# Patient Record
Sex: Female | Born: 1965
Health system: Southern US, Community
[De-identification: ages and names within clinical notes are randomized; demographics above are authoritative.]

## PROBLEM LIST (undated history)

## (undated) DIAGNOSIS — F419 Anxiety disorder, unspecified: Secondary | ICD-10-CM

## (undated) HISTORY — PX: TUBAL LIGATION: SHX77

## (undated) HISTORY — DX: Anxiety disorder, unspecified: F41.9

---

## 1999-02-20 ENCOUNTER — Emergency Department (HOSPITAL_COMMUNITY): Admission: EM | Admit: 1999-02-20 | Discharge: 1999-02-20 | Payer: Self-pay | Admitting: Emergency Medicine

## 1999-02-20 ENCOUNTER — Encounter: Payer: Self-pay | Admitting: Emergency Medicine

## 1999-08-29 ENCOUNTER — Encounter (HOSPITAL_BASED_OUTPATIENT_CLINIC_OR_DEPARTMENT_OTHER): Payer: Self-pay | Admitting: General Surgery

## 1999-08-29 ENCOUNTER — Emergency Department (HOSPITAL_COMMUNITY): Admission: EM | Admit: 1999-08-29 | Discharge: 1999-08-29 | Payer: Self-pay | Admitting: Emergency Medicine

## 2000-07-08 ENCOUNTER — Encounter: Payer: Self-pay | Admitting: Family Medicine

## 2000-07-08 ENCOUNTER — Encounter: Admission: RE | Admit: 2000-07-08 | Discharge: 2000-07-08 | Payer: Self-pay | Admitting: Family Medicine

## 2000-09-26 ENCOUNTER — Encounter: Payer: Self-pay | Admitting: Family Medicine

## 2000-09-26 ENCOUNTER — Encounter: Admission: RE | Admit: 2000-09-26 | Discharge: 2000-09-26 | Payer: Self-pay | Admitting: Family Medicine

## 2000-10-16 ENCOUNTER — Encounter: Admission: RE | Admit: 2000-10-16 | Discharge: 2000-10-21 | Payer: Self-pay | Admitting: Family Medicine

## 2002-06-21 ENCOUNTER — Emergency Department (HOSPITAL_COMMUNITY): Admission: EM | Admit: 2002-06-21 | Discharge: 2002-06-22 | Payer: Self-pay

## 2011-01-26 ENCOUNTER — Encounter: Payer: Self-pay | Admitting: Emergency Medicine

## 2011-01-26 ENCOUNTER — Emergency Department (HOSPITAL_COMMUNITY): Payer: No Typology Code available for payment source

## 2011-01-26 ENCOUNTER — Emergency Department (HOSPITAL_COMMUNITY)
Admission: EM | Admit: 2011-01-26 | Discharge: 2011-01-26 | Disposition: A | Discharge status: Home / Self Care | Payer: No Typology Code available for payment source | Attending: Emergency Medicine | Admitting: Emergency Medicine

## 2011-01-26 DIAGNOSIS — R0789 Other chest pain: Secondary | ICD-10-CM | POA: Insufficient documentation

## 2011-01-26 DIAGNOSIS — S335XXA Sprain of ligaments of lumbar spine, initial encounter: Secondary | ICD-10-CM | POA: Insufficient documentation

## 2011-01-26 DIAGNOSIS — M545 Low back pain, unspecified: Secondary | ICD-10-CM | POA: Insufficient documentation

## 2011-01-26 DIAGNOSIS — S39012A Strain of muscle, fascia and tendon of lower back, initial encounter: Secondary | ICD-10-CM

## 2011-01-26 MED ORDER — CYCLOBENZAPRINE HCL 10 MG PO TABS: 10.0000 mg | ORAL_TABLET | Freq: Three times a day (TID) | ORAL | Status: AC | PRN

## 2011-01-26 MED ORDER — OXYCODONE-ACETAMINOPHEN 5-325 MG PO TABS
1.0000 | ORAL_TABLET | Freq: Once | ORAL | Status: AC
  Administered 2011-01-26: 1 via ORAL
  Filled 2011-01-26: qty 1

## 2011-01-26 MED ORDER — IBUPROFEN 800 MG PO TABS: 800.0000 mg | ORAL_TABLET | Freq: Three times a day (TID) | ORAL | Status: AC | PRN

## 2011-01-26 MED ORDER — HYDROCODONE-ACETAMINOPHEN 5-325 MG PO TABS: 1.0000 | ORAL_TABLET | Freq: Four times a day (QID) | ORAL | Status: AC | PRN
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
## 2011-01-26 NOTE — ED Notes (Signed)
C-collar removed by RN per PA.

## 2011-01-29 ENCOUNTER — Encounter (HOSPITAL_COMMUNITY): Payer: Self-pay | Admitting: *Deleted

## 2011-01-29 ENCOUNTER — Emergency Department (INDEPENDENT_AMBULATORY_CARE_PROVIDER_SITE_OTHER)
Admission: EM | Admit: 2011-01-29 | Discharge: 2011-01-29 | Disposition: A | Discharge status: Home / Self Care | Payer: Self-pay | Source: Home / Self Care

## 2011-01-29 DIAGNOSIS — S161XXA Strain of muscle, fascia and tendon at neck level, initial encounter: Secondary | ICD-10-CM

## 2011-01-29 DIAGNOSIS — S39012A Strain of muscle, fascia and tendon of lower back, initial encounter: Secondary | ICD-10-CM

## 2011-01-29 DIAGNOSIS — S335XXA Sprain of ligaments of lumbar spine, initial encounter: Secondary | ICD-10-CM

## 2011-01-29 DIAGNOSIS — S139XXA Sprain of joints and ligaments of unspecified parts of neck, initial encounter: Secondary | ICD-10-CM

## 2011-01-29 NOTE — ED Provider Notes (Signed)
Medical screening examination/treatment/procedure(s) were performed by non-physician practitioner and as supervising physician I was immediately available for consultation/collaboration. Simuel Stebner Y.   Gavin Pound. Azaylea Maves, MD 01/29/11 1015
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
## 2011-01-29 NOTE — ED Provider Notes (Signed)
Medical screening examination/treatment/procedure(s) were performed by non-physician practitioner and as supervising physician I was immediately available for consultation/collaboration.  Raynald Blend, MD 01/29/11 1220
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
## 2011-02-01 ENCOUNTER — Ambulatory Visit: Payer: BC Managed Care – PPO

## 2011-02-01 NOTE — ED Notes (Signed)
Pt. brought in FMLA papers.  She was seen in ED 12/28 -no work note. Came here 12/31 and we gave work note to return 1/2. This put her into FMLA.  Pt. does not have a PCP.  Will give to [facility] PA.Vassie Moselle  02/01/2011

## 2011-02-07 NOTE — ED Notes (Signed)
1/7  Dawn said pt. was still having pain and should f/u with the orthopedist. He is the one that should fill them out because pt. may need PT and be out longer.  I called pt. 1/8 and left message to call. Pt. called back today and she said she already had it taken care of. I asked her to come back and pick up her papers. I would leave them at the front desk.  Pt. said she would do this. Vassie Moselle 02/07/2011

## 2011-07-24 ENCOUNTER — Ambulatory Visit (INDEPENDENT_AMBULATORY_CARE_PROVIDER_SITE_OTHER): Payer: BC Managed Care – PPO | Admitting: Physician Assistant

## 2011-07-24 VITALS — BP 136/82 | HR 65 | Temp 98.6°F | Resp 16 | Ht 64.5 in | Wt 158.4 lb

## 2011-07-24 DIAGNOSIS — Z111 Encounter for screening for respiratory tuberculosis: Secondary | ICD-10-CM

## 2011-07-24 NOTE — Patient Instructions (Signed)
Read TB test in 48-72 hrs  

## 2011-07-24 NOTE — Progress Notes (Signed)
  Subjective:    Patient ID: Michelle Hanson, female    DOB: 1965-08-06, 46 y.o.   MRN: [id number]  HPI No health problems.  Only here for TB test.  No h/o exposure.  No night sweats, cough, or weight loss.   Review of Systems  All other systems reviewed and are negative.       Objective:   Physical Exam  Nursing note and vitals reviewed. Constitutional: She appears well-developed and well-nourished.  HENT:  Head: Normocephalic and atraumatic.  Cardiovascular: Normal rate, regular rhythm and normal heart sounds.   Pulmonary/Chest: Effort normal and breath sounds normal.       Assessment & Plan:  Screening for TB-placing TB

## 2011-07-26 ENCOUNTER — Encounter (INDEPENDENT_AMBULATORY_CARE_PROVIDER_SITE_OTHER): Payer: BC Managed Care – PPO

## 2011-07-26 DIAGNOSIS — Z111 Encounter for screening for respiratory tuberculosis: Secondary | ICD-10-CM

## 2011-07-26 LAB — TB SKIN TEST
Induration: 0 mm
TB Skin Test: NEGATIVE

## 2011-08-16 ENCOUNTER — Other Ambulatory Visit: Payer: Self-pay | Admitting: Family Medicine

## 2011-08-16 DIAGNOSIS — R1032 Left lower quadrant pain: Secondary | ICD-10-CM

## 2011-08-17 ENCOUNTER — Ambulatory Visit
Admission: RE | Admit: 2011-08-17 | Discharge: 2011-08-17 | Disposition: A | Discharge status: Home / Self Care | Payer: BC Managed Care – PPO | Source: Ambulatory Visit | Attending: Family Medicine | Admitting: Family Medicine

## 2011-08-17 DIAGNOSIS — R1032 Left lower quadrant pain: Secondary | ICD-10-CM

## 2011-08-17 MED ORDER — IOHEXOL 300 MG/ML  SOLN
100.0000 mL | Freq: Once | INTRAMUSCULAR | Status: AC | PRN
  Administered 2011-08-17: 100 mL via INTRAVENOUS

## 2012-03-27 ENCOUNTER — Other Ambulatory Visit: Payer: Self-pay

## 2012-03-27 DIAGNOSIS — Z1231 Encounter for screening mammogram for malignant neoplasm of breast: Secondary | ICD-10-CM

## 2012-04-25 ENCOUNTER — Ambulatory Visit: Payer: BC Managed Care – PPO

## 2012-05-16 ENCOUNTER — Emergency Department (HOSPITAL_COMMUNITY)
Admission: EM | Admit: 2012-05-16 | Discharge: 2012-05-16 | Disposition: A | Discharge status: Home / Self Care | Payer: No Typology Code available for payment source | Attending: Emergency Medicine | Admitting: Emergency Medicine

## 2012-05-16 ENCOUNTER — Encounter (HOSPITAL_COMMUNITY): Payer: Self-pay | Admitting: *Deleted

## 2012-05-16 DIAGNOSIS — Y9389 Activity, other specified: Secondary | ICD-10-CM | POA: Insufficient documentation

## 2012-05-16 DIAGNOSIS — IMO0002 Reserved for concepts with insufficient information to code with codable children: Secondary | ICD-10-CM | POA: Insufficient documentation

## 2012-05-16 DIAGNOSIS — Y92410 Unspecified street and highway as the place of occurrence of the external cause: Secondary | ICD-10-CM | POA: Insufficient documentation

## 2012-05-16 DIAGNOSIS — T148XXA Other injury of unspecified body region, initial encounter: Secondary | ICD-10-CM

## 2012-05-16 MED ORDER — HYDROCODONE-ACETAMINOPHEN 5-325 MG PO TABS: 1.0000 | ORAL_TABLET | ORAL | Status: DC | PRN

## 2012-05-16 MED ORDER — CYCLOBENZAPRINE HCL 10 MG PO TABS: 10.0000 mg | ORAL_TABLET | Freq: Three times a day (TID) | ORAL | Status: DC

## 2012-05-16 MED ORDER — HYDROCODONE-ACETAMINOPHEN 5-325 MG PO TABS: 1.0000 | ORAL_TABLET | Freq: Once | ORAL | Status: DC

## 2012-05-16 MED ORDER — CYCLOBENZAPRINE HCL 10 MG PO TABS: 5.0000 mg | ORAL_TABLET | Freq: Once | ORAL | Status: DC

## 2012-05-16 MED ORDER — IBUPROFEN 800 MG PO TABS: 800.0000 mg | ORAL_TABLET | Freq: Three times a day (TID) | ORAL | Status: DC
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
## 2012-05-17 NOTE — ED Provider Notes (Signed)
Medical screening examination/treatment/procedure(s) were performed by non-physician practitioner and as supervising physician I was immediately available for consultation/collaboration.   Nahomy Limburg E Madaleine Simmon, MD 05/17/12 1202 

## 2012-12-23 ENCOUNTER — Ambulatory Visit: Payer: BC Managed Care – PPO

## 2013-04-22 ENCOUNTER — Other Ambulatory Visit: Payer: Self-pay

## 2013-04-22 DIAGNOSIS — Z1231 Encounter for screening mammogram for malignant neoplasm of breast: Secondary | ICD-10-CM

## 2013-05-18 ENCOUNTER — Ambulatory Visit
Admission: RE | Admit: 2013-05-18 | Discharge: 2013-05-18 | Disposition: A | Discharge status: Home / Self Care | Payer: BC Managed Care – PPO | Source: Ambulatory Visit

## 2013-05-18 DIAGNOSIS — Z1231 Encounter for screening mammogram for malignant neoplasm of breast: Secondary | ICD-10-CM

## 2013-05-21 ENCOUNTER — Other Ambulatory Visit: Payer: Self-pay | Admitting: Obstetrics and Gynecology

## 2013-05-21 DIAGNOSIS — R928 Other abnormal and inconclusive findings on diagnostic imaging of breast: Secondary | ICD-10-CM

## 2013-06-01 ENCOUNTER — Ambulatory Visit
Admission: RE | Admit: 2013-06-01 | Discharge: 2013-06-01 | Disposition: A | Discharge status: Home / Self Care | Payer: BC Managed Care – PPO | Source: Ambulatory Visit | Attending: Obstetrics and Gynecology | Admitting: Obstetrics and Gynecology

## 2013-06-01 DIAGNOSIS — R928 Other abnormal and inconclusive findings on diagnostic imaging of breast: Secondary | ICD-10-CM

## 2015-07-21 ENCOUNTER — Other Ambulatory Visit: Payer: Self-pay | Admitting: Family Medicine

## 2015-07-21 ENCOUNTER — Ambulatory Visit
Admission: RE | Admit: 2015-07-21 | Discharge: 2015-07-21 | Disposition: A | Discharge status: Home / Self Care | Payer: BLUE CROSS/BLUE SHIELD | Source: Ambulatory Visit | Attending: Family Medicine | Admitting: Family Medicine

## 2015-07-21 DIAGNOSIS — K6389 Other specified diseases of intestine: Secondary | ICD-10-CM | POA: Diagnosis not present

## 2015-07-21 DIAGNOSIS — R1030 Lower abdominal pain, unspecified: Secondary | ICD-10-CM | POA: Diagnosis not present

## 2015-07-21 DIAGNOSIS — K5901 Slow transit constipation: Secondary | ICD-10-CM | POA: Diagnosis not present

## 2015-08-15 DIAGNOSIS — Z111 Encounter for screening for respiratory tuberculosis: Secondary | ICD-10-CM | POA: Diagnosis not present

## 2015-08-15 DIAGNOSIS — K5901 Slow transit constipation: Secondary | ICD-10-CM | POA: Diagnosis not present

## 2015-08-15 DIAGNOSIS — Z0000 Encounter for general adult medical examination without abnormal findings: Secondary | ICD-10-CM | POA: Diagnosis not present

## 2015-08-15 DIAGNOSIS — N3281 Overactive bladder: Secondary | ICD-10-CM | POA: Diagnosis not present

## 2015-08-15 DIAGNOSIS — Z13220 Encounter for screening for lipoid disorders: Secondary | ICD-10-CM | POA: Diagnosis not present

## 2015-08-17 ENCOUNTER — Other Ambulatory Visit: Payer: Self-pay | Admitting: Family Medicine

## 2015-08-17 DIAGNOSIS — R5381 Other malaise: Secondary | ICD-10-CM

## 2015-08-25 ENCOUNTER — Other Ambulatory Visit: Payer: Self-pay | Admitting: Family Medicine

## 2015-08-25 DIAGNOSIS — R921 Mammographic calcification found on diagnostic imaging of breast: Secondary | ICD-10-CM

## 2015-08-29 ENCOUNTER — Other Ambulatory Visit: Payer: BLUE CROSS/BLUE SHIELD

## 2015-09-26 ENCOUNTER — Ambulatory Visit
Admission: RE | Admit: 2015-09-26 | Discharge: 2015-09-26 | Disposition: A | Discharge status: Home / Self Care | Payer: BLUE CROSS/BLUE SHIELD | Source: Ambulatory Visit | Attending: Family Medicine | Admitting: Family Medicine

## 2015-09-26 DIAGNOSIS — N6002 Solitary cyst of left breast: Secondary | ICD-10-CM | POA: Diagnosis not present

## 2015-09-26 DIAGNOSIS — R921 Mammographic calcification found on diagnostic imaging of breast: Secondary | ICD-10-CM

## 2016-06-14 DIAGNOSIS — R1033 Periumbilical pain: Secondary | ICD-10-CM | POA: Diagnosis not present

## 2016-06-14 DIAGNOSIS — K5901 Slow transit constipation: Secondary | ICD-10-CM | POA: Diagnosis not present

## 2016-08-21 DIAGNOSIS — K5909 Other constipation: Secondary | ICD-10-CM | POA: Diagnosis not present

## 2016-08-21 DIAGNOSIS — R0789 Other chest pain: Secondary | ICD-10-CM | POA: Diagnosis not present

## 2016-08-21 DIAGNOSIS — Z111 Encounter for screening for respiratory tuberculosis: Secondary | ICD-10-CM | POA: Diagnosis not present

## 2016-08-21 DIAGNOSIS — Z0000 Encounter for general adult medical examination without abnormal findings: Secondary | ICD-10-CM | POA: Diagnosis not present

## 2016-10-30 ENCOUNTER — Other Ambulatory Visit: Payer: Self-pay | Admitting: Obstetrics and Gynecology

## 2016-10-30 DIAGNOSIS — Z1231 Encounter for screening mammogram for malignant neoplasm of breast: Secondary | ICD-10-CM

## 2016-11-08 DIAGNOSIS — Z6832 Body mass index (BMI) 32.0-32.9, adult: Secondary | ICD-10-CM | POA: Diagnosis not present

## 2016-11-08 DIAGNOSIS — Z01419 Encounter for gynecological examination (general) (routine) without abnormal findings: Secondary | ICD-10-CM | POA: Diagnosis not present

## 2016-11-08 DIAGNOSIS — Z1151 Encounter for screening for human papillomavirus (HPV): Secondary | ICD-10-CM | POA: Diagnosis not present

## 2016-11-08 DIAGNOSIS — R87610 Atypical squamous cells of undetermined significance on cytologic smear of cervix (ASC-US): Secondary | ICD-10-CM | POA: Diagnosis not present

## 2016-11-08 DIAGNOSIS — Z1231 Encounter for screening mammogram for malignant neoplasm of breast: Secondary | ICD-10-CM | POA: Diagnosis not present

## 2016-11-08 DIAGNOSIS — R102 Pelvic and perineal pain: Secondary | ICD-10-CM | POA: Diagnosis not present

## 2016-11-20 ENCOUNTER — Ambulatory Visit: Payer: BLUE CROSS/BLUE SHIELD

## 2016-11-22 DIAGNOSIS — D259 Leiomyoma of uterus, unspecified: Secondary | ICD-10-CM | POA: Diagnosis not present

## 2016-11-22 DIAGNOSIS — N944 Primary dysmenorrhea: Secondary | ICD-10-CM | POA: Diagnosis not present

## 2017-07-08 IMAGING — CR DG ABDOMEN 2V
2 series · 2 of 2 positions shown · non-contrast
Comparison: CT 08/17/2011.  KUB 01/26/2011 .

CLINICAL DATA: Pain.

EXAM:
ABDOMEN - 2 VIEW

[w abdomen upright]
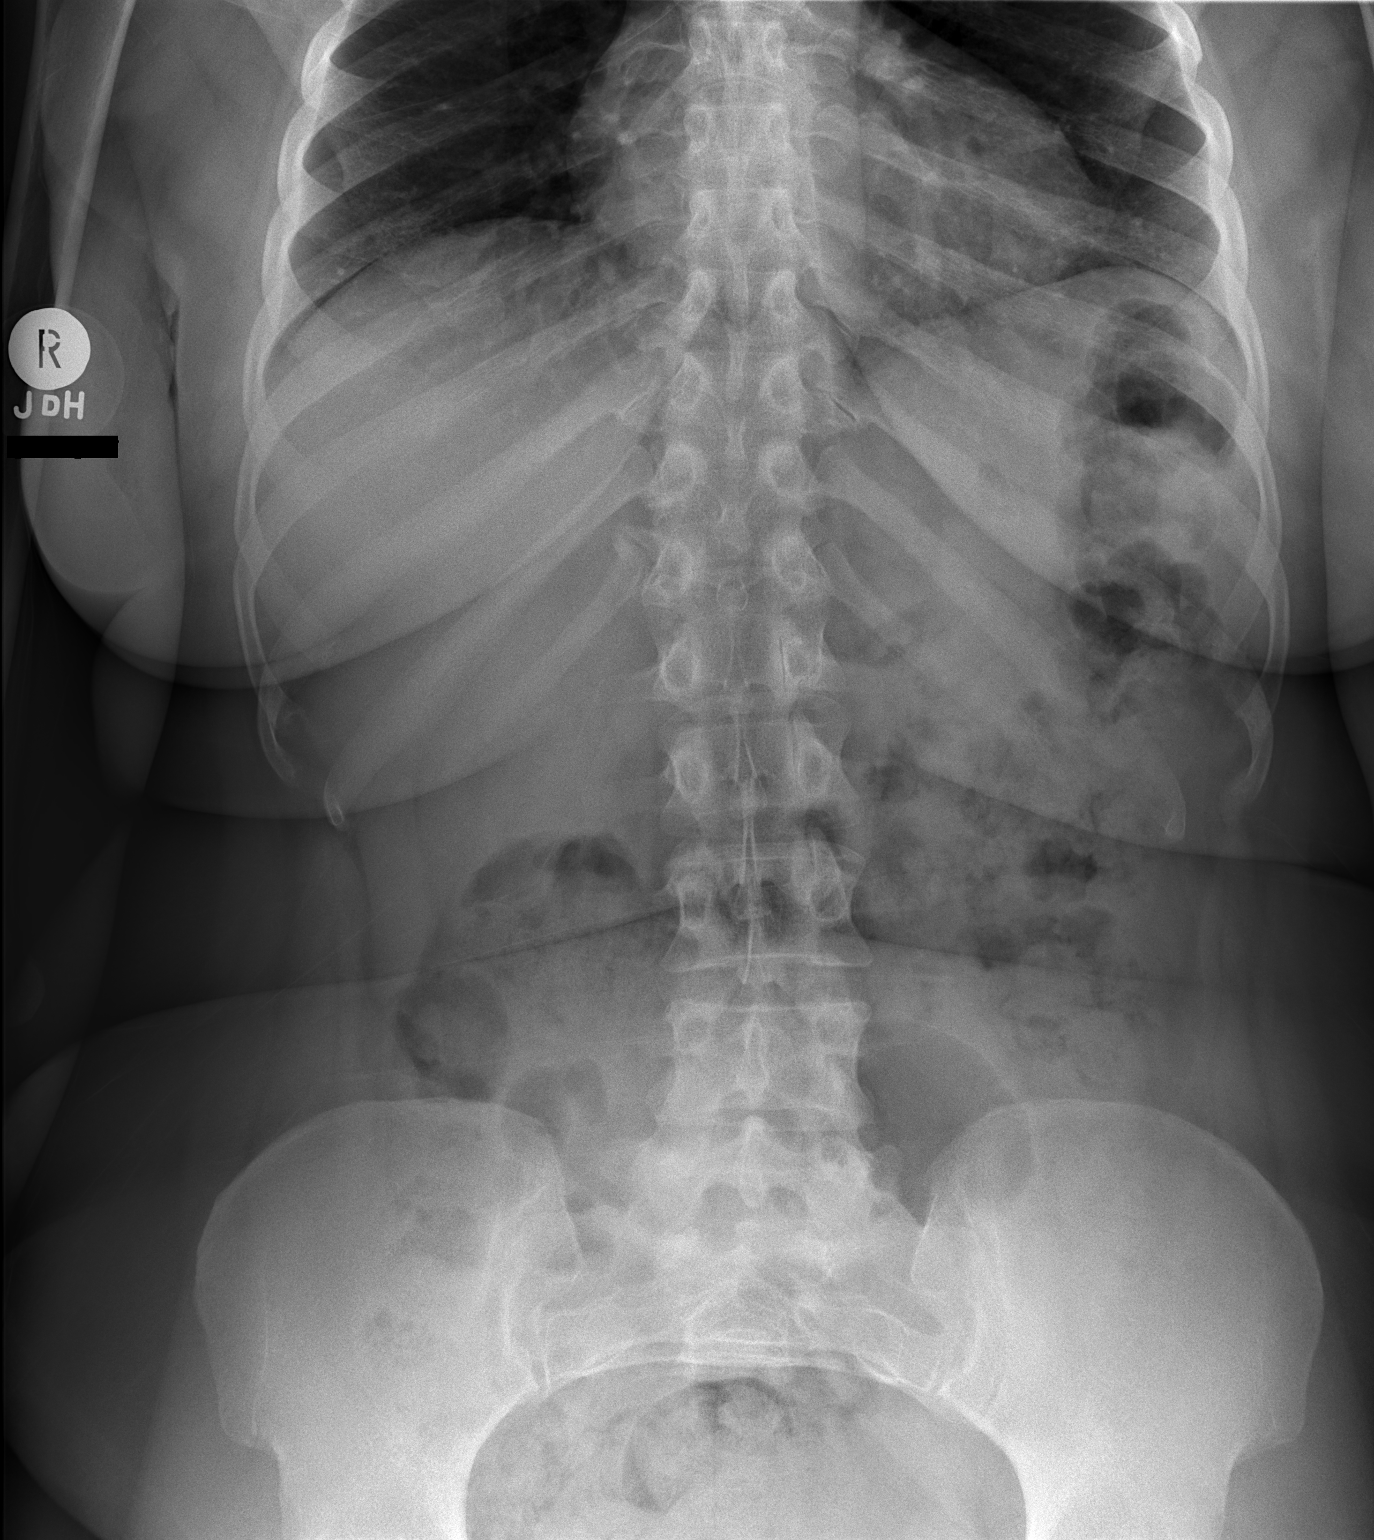

[t abdomen supine]
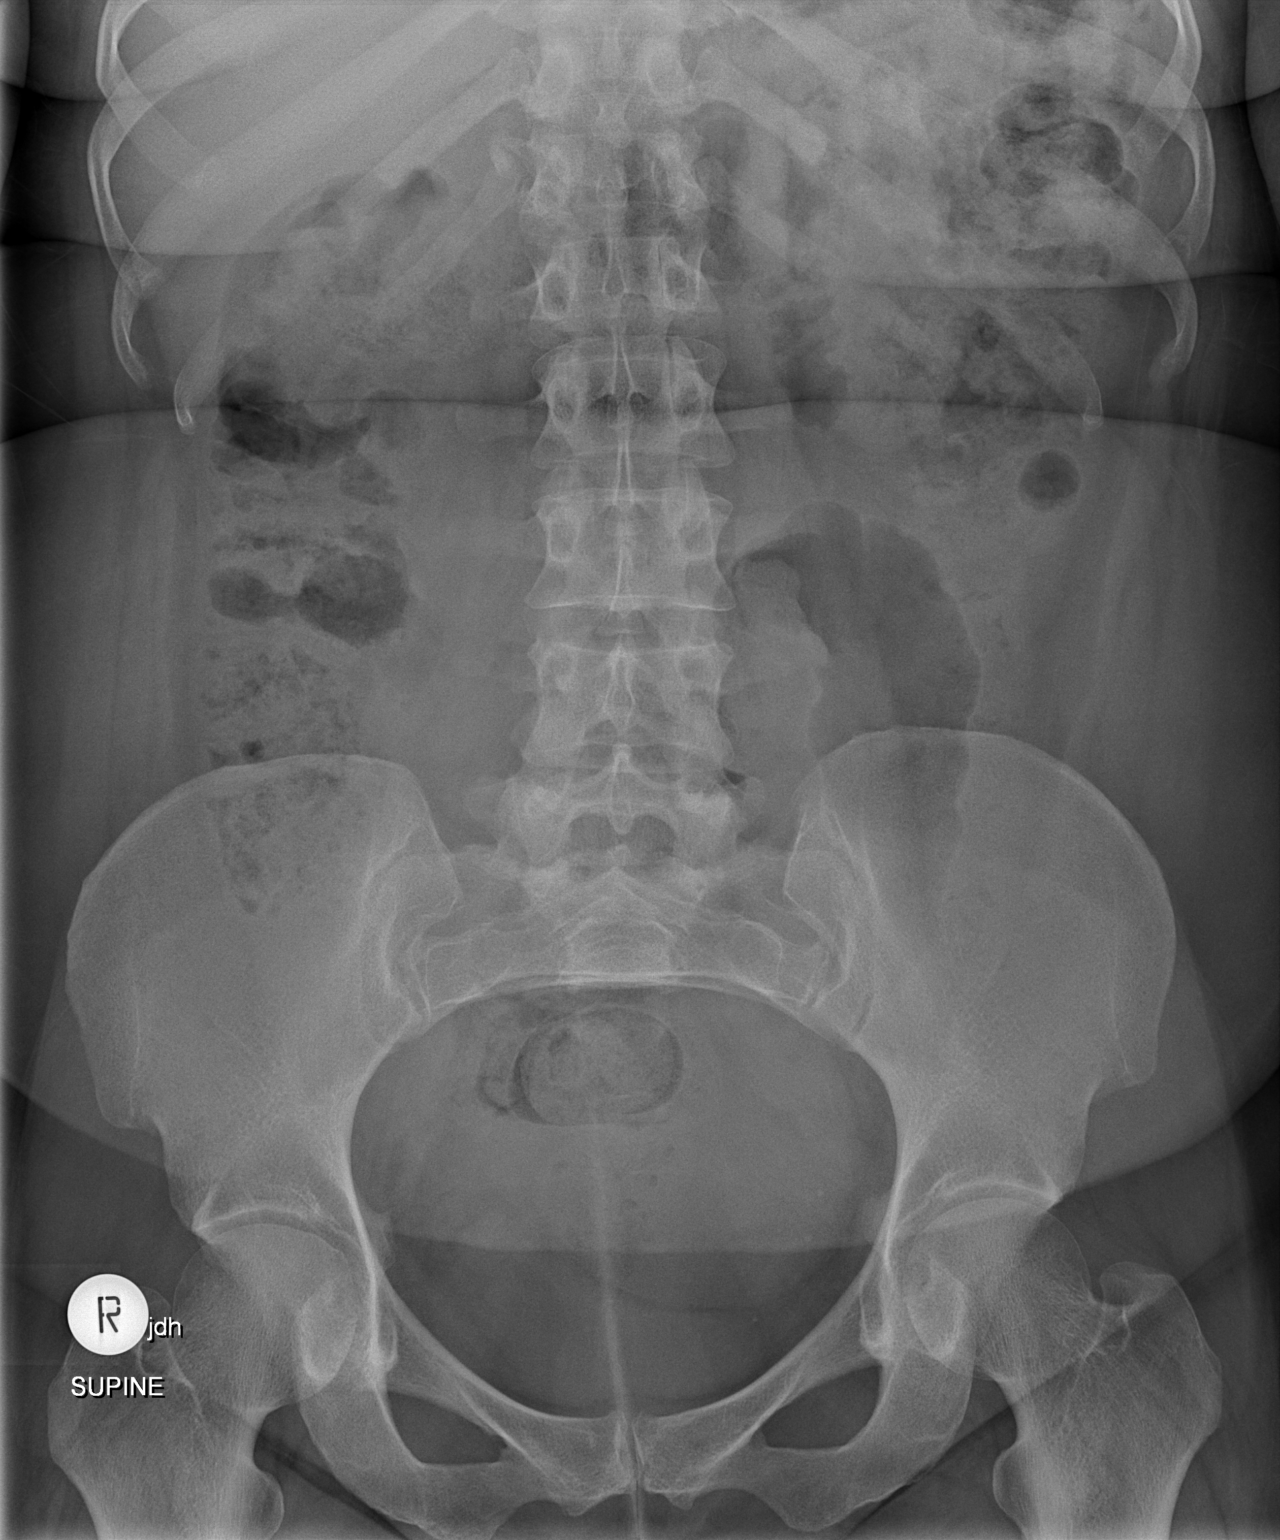

[2 of 2 positions shown; findings below may reference images not displayed]

FINDINGS: Soft tissue structures are unremarkable. Large amount of stool noted
throughout the colon. Constipation cannot be excluded. Mild
associated bowel distention. Follow-up abdominal series suggested
for continued evaluate. No free air. No acute bony abnormality.
IMPRESSION: Large amount of stool noted throughout the colon consistent with
constipation. Mild associated bowel distention. Follow-up abdominal
series suggested to exclude progressive bowel distention.

## 2017-08-27 DIAGNOSIS — R230 Cyanosis: Secondary | ICD-10-CM | POA: Diagnosis not present

## 2017-08-27 DIAGNOSIS — Z130 Encounter for screening for diseases of the blood and blood-forming organs and certain disorders involving the immune mechanism: Secondary | ICD-10-CM | POA: Diagnosis not present

## 2017-08-27 DIAGNOSIS — R232 Flushing: Secondary | ICD-10-CM | POA: Diagnosis not present

## 2017-08-27 DIAGNOSIS — R899 Unspecified abnormal finding in specimens from other organs, systems and tissues: Secondary | ICD-10-CM | POA: Diagnosis not present

## 2017-08-27 DIAGNOSIS — Z0000 Encounter for general adult medical examination without abnormal findings: Secondary | ICD-10-CM | POA: Diagnosis not present

## 2017-08-27 DIAGNOSIS — D259 Leiomyoma of uterus, unspecified: Secondary | ICD-10-CM | POA: Diagnosis not present

## 2017-08-27 DIAGNOSIS — Z111 Encounter for screening for respiratory tuberculosis: Secondary | ICD-10-CM | POA: Diagnosis not present

## 2017-08-27 DIAGNOSIS — R109 Unspecified abdominal pain: Secondary | ICD-10-CM | POA: Diagnosis not present

## 2017-08-27 DIAGNOSIS — Z1329 Encounter for screening for other suspected endocrine disorder: Secondary | ICD-10-CM | POA: Diagnosis not present

## 2017-08-29 DIAGNOSIS — Z111 Encounter for screening for respiratory tuberculosis: Secondary | ICD-10-CM | POA: Diagnosis not present

## 2017-11-11 DIAGNOSIS — Z01419 Encounter for gynecological examination (general) (routine) without abnormal findings: Secondary | ICD-10-CM | POA: Diagnosis not present

## 2017-11-11 DIAGNOSIS — Z6833 Body mass index (BMI) 33.0-33.9, adult: Secondary | ICD-10-CM | POA: Diagnosis not present

## 2017-11-11 DIAGNOSIS — Z13220 Encounter for screening for lipoid disorders: Secondary | ICD-10-CM | POA: Diagnosis not present

## 2017-11-11 DIAGNOSIS — Z1151 Encounter for screening for human papillomavirus (HPV): Secondary | ICD-10-CM | POA: Diagnosis not present

## 2017-11-11 DIAGNOSIS — Z1231 Encounter for screening mammogram for malignant neoplasm of breast: Secondary | ICD-10-CM | POA: Diagnosis not present

## 2018-04-02 DIAGNOSIS — R42 Dizziness and giddiness: Secondary | ICD-10-CM | POA: Diagnosis not present

## 2018-04-02 DIAGNOSIS — K5909 Other constipation: Secondary | ICD-10-CM | POA: Diagnosis not present

## 2018-04-23 DIAGNOSIS — G43009 Migraine without aura, not intractable, without status migrainosus: Secondary | ICD-10-CM | POA: Diagnosis not present

## 2018-04-23 DIAGNOSIS — K5901 Slow transit constipation: Secondary | ICD-10-CM | POA: Diagnosis not present

## 2018-09-02 DIAGNOSIS — K5900 Constipation, unspecified: Secondary | ICD-10-CM | POA: Diagnosis not present

## 2018-09-02 DIAGNOSIS — R109 Unspecified abdominal pain: Secondary | ICD-10-CM | POA: Diagnosis not present

## 2018-09-02 DIAGNOSIS — K921 Melena: Secondary | ICD-10-CM | POA: Diagnosis not present

## 2018-10-13 DIAGNOSIS — Z1159 Encounter for screening for other viral diseases: Secondary | ICD-10-CM | POA: Diagnosis not present

## 2018-10-17 DIAGNOSIS — R1084 Generalized abdominal pain: Secondary | ICD-10-CM | POA: Diagnosis not present

## 2018-10-17 DIAGNOSIS — K921 Melena: Secondary | ICD-10-CM | POA: Diagnosis not present

## 2018-10-17 DIAGNOSIS — K5900 Constipation, unspecified: Secondary | ICD-10-CM | POA: Diagnosis not present

## 2018-10-17 DIAGNOSIS — Z1211 Encounter for screening for malignant neoplasm of colon: Secondary | ICD-10-CM | POA: Diagnosis not present

## 2018-10-24 DIAGNOSIS — Z79899 Other long term (current) drug therapy: Secondary | ICD-10-CM | POA: Diagnosis not present

## 2018-10-24 DIAGNOSIS — F419 Anxiety disorder, unspecified: Secondary | ICD-10-CM | POA: Diagnosis not present

## 2018-10-24 DIAGNOSIS — R079 Chest pain, unspecified: Secondary | ICD-10-CM | POA: Diagnosis not present

## 2018-10-24 DIAGNOSIS — I10 Essential (primary) hypertension: Secondary | ICD-10-CM | POA: Diagnosis not present

## 2018-10-24 DIAGNOSIS — Z7982 Long term (current) use of aspirin: Secondary | ICD-10-CM | POA: Diagnosis not present

## 2018-10-24 DIAGNOSIS — R42 Dizziness and giddiness: Secondary | ICD-10-CM | POA: Diagnosis not present

## 2018-10-24 DIAGNOSIS — R450 Nervousness: Secondary | ICD-10-CM | POA: Diagnosis not present

## 2018-10-24 DIAGNOSIS — R002 Palpitations: Secondary | ICD-10-CM | POA: Diagnosis not present

## 2018-10-27 DIAGNOSIS — F419 Anxiety disorder, unspecified: Secondary | ICD-10-CM | POA: Diagnosis not present

## 2018-10-27 DIAGNOSIS — R002 Palpitations: Secondary | ICD-10-CM | POA: Diagnosis not present

## 2018-11-05 ENCOUNTER — Encounter: Payer: Self-pay | Admitting: Cardiology

## 2018-11-05 ENCOUNTER — Ambulatory Visit: Payer: BC Managed Care – PPO | Admitting: Cardiology

## 2018-11-05 ENCOUNTER — Other Ambulatory Visit: Payer: Self-pay

## 2018-11-05 VITALS — BP 131/83 | HR 75 | Ht 64.5 in | Wt 187.2 lb

## 2018-11-05 DIAGNOSIS — I493 Ventricular premature depolarization: Secondary | ICD-10-CM

## 2018-11-05 DIAGNOSIS — R002 Palpitations: Secondary | ICD-10-CM

## 2018-11-05 DIAGNOSIS — R0789 Other chest pain: Secondary | ICD-10-CM

## 2018-11-05 DIAGNOSIS — F419 Anxiety disorder, unspecified: Secondary | ICD-10-CM

## 2018-11-05 DIAGNOSIS — R030 Elevated blood-pressure reading, without diagnosis of hypertension: Secondary | ICD-10-CM

## 2018-11-05 DIAGNOSIS — R0602 Shortness of breath: Secondary | ICD-10-CM

## 2018-11-05 MED ORDER — METOPROLOL SUCCINATE ER 25 MG PO TB24: 25.0000 mg | ORAL_TABLET | Freq: Every day | ORAL | 3 refills | Status: DC

## 2018-11-05 NOTE — Progress Notes (Signed)
Cardiology Consult Note    Date:  11/09/2018   ID:  [name], DOB 03-11-1965, MRN [id number]  PCP:  Patient, No Pcp Per  Cardiologist:  Fransico Him, MD   Chief Complaint  Patient presents with  . New Patient (Initial Visit)    Palptitations    History of Present Illness:  Michelle Hanson is a 53 y.o. female who is being seen today for the evaluation of palpitations at the request of Shirline Frees, MD.  This is a 53yo female with no prior medical hx who recently was seen by her PCP complaining of palpitations.  She recently started having palpitations mainly at night where her heart would be "beating out of my chest" associated with SOB. She has had several episodes of this and one episode resulted in EMS being called and she was found to have an elevated BP at 202/2mmHg and HR 84bpm.  EKG in ER showed NSR with PVCs. She tells me that she has have a lot of anxiety going to work recently due to [place] and was placed on Lorazepam and Propranolol.  She also had been having episodic pressure in her chest with the palpitations.  She does a lot of strenuous work at her job and notices significant SOB after carrying things up 3 flights of stairs.   She denies any PND, orthopnea, LE edema or syncope. She will have some dizziness with the palpitations. She is compliant with her meds and is tolerating meds with no SE.    Past Medical History:  Diagnosis Date  . Anxiety     Past Surgical History:  Procedure Laterality Date  . TUBAL LIGATION      Current Medications: Current Meds  Medication Sig  . ibuprofen (ADVIL,MOTRIN) 800 MG tablet Take 1 tablet (800 mg total) by mouth 3 (three) times daily.  Marland Kitchen LORazepam (ATIVAN) 0.5 MG tablet TAKE 1 TABLET BY MOUTH ONCE DAILY AS NEEDED FOR ANXIETY  . Multiple Vitamin (MULTIVITAMIN WITH MINERALS) TABS Take 1 tablet by mouth daily.  . Multiple Vitamin (MULTIVITAMIN) tablet Take 1 tablet by mouth daily.  . [DISCONTINUED] propranolol ER (INDERAL  LA) 60 MG 24 hr capsule TAKE 1 CAPSULE BY MOUTH ONCE DAILY UNTIL DIRECTED TO STOP. USE AT NIGHT FOR CALMNESS    Allergies:   Patient has no known allergies.   Social History   Socioeconomic History  . Marital status: Divorced    Spouse name: Not on file  . Number of children: Not on file  . Years of education: Not on file  . Highest education level: Not on file  Occupational History  . Not on file  Social Needs  . Financial resource strain: Not on file  . Food insecurity    Worry: Not on file    Inability: Not on file  . Transportation needs    Medical: Not on file    Non-medical: Not on file  Tobacco Use  . Smoking status: Never Smoker  . Smokeless tobacco: Never Used  Substance and Sexual Activity  . Alcohol use: No  . Drug use: No  . Sexual activity: Yes    Birth control/protection: Injection  Lifestyle  . Physical activity    Days per week: Not on file    Minutes per session: Not on file  . Stress: Not on file  Relationships  . Social Herbalist on phone: Not on file    Gets together: Not on file    Attends religious  service: Not on file    Active member of club or organization: Not on file    Attends meetings of clubs or organizations: Not on file    Relationship status: Not on file  Other Topics Concern  . Not on file  Social History Narrative  . Not on file     Family History:  The patient's family history is not on file.   ROS:   Please see the history of present illness.    ROS All other systems reviewed and are negative.  No flowsheet data found.     PHYSICAL EXAM:   VS:  BP 131/83   Pulse 75   Ht 5' 4.5" (1.638 m)   Wt 187 lb 3.2 oz (84.9 kg)   BMI 31.64 kg/m    GEN: Well nourished, well developed, in no acute distress  HEENT: normal  Neck: no JVD, carotid bruits, or masses Cardiac: RRR; no murmurs, rubs, or gallops,no edema.  Intact distal pulses bilaterally.  Respiratory:  clear to auscultation bilaterally, normal work of  breathing GI: soft, nontender, nondistended, + BS MS: no deformity or atrophy  Skin: warm and dry, no rash Neuro:  Alert and Oriented x 3, Strength and sensation are intact Psych: euthymic mood, full affect  Wt Readings from Last 3 Encounters:  11/05/18 187 lb 3.2 oz (84.9 kg)  05/16/12 168 lb (76.2 kg)  07/24/11 158 lb 6.4 oz (71.8 kg)      Studies/Labs Reviewed:   EKG:  EKG is not ordered today  Recent Labs: No results found for requested labs within last 8760 hours.   Lipid Panel No results found for: CHOL, TRIG, HDL, CHOLHDL, VLDL, LDLCALC, LDLDIRECT  Additional studies/ records that were reviewed today include:  Office notes from PCP    ASSESSMENT:    1. Palpitations   2. Anxiety   3. PVC's (premature ventricular contractions)   4. Elevated blood pressure reading in office without diagnosis of hypertension   5. Atypical chest pain   6. SOB (shortness of breath)      PLAN:  In order of problems listed above:  1. Palpitations -EKG at [facility] ER showed PVCs but she describes episodes of very fast tachycardia and has a fm hx of afib in her mother, sister and brother -I will get an event monitor to assess PVC load and rule out PAF -stop Propranolol and start Toprol XL 25mg  daily  2.  Elevated BP -her BP was elevated in ER ? Related to anxiety -BP stable today -starting Toprol which should help  3.  SOB -this occurs after walking up multiple steps and may be due to deconditioning -check 2D echo to assess LVF  4.  Atypical CP -occurs with palpitations -likely related to PVCs -she has no fm hx of CAD and has never smoked -check 2D echo    Medication Adjustments/Labs and Tests Ordered: Current medicines are reviewed at length with the patient today.  Concerns regarding medicines are outlined above.  Medication changes, Labs and Tests ordered today are listed in the Patient Instructions below.  Patient Instructions  Medication Instructions:    STOP TAKING PROPRANOLOL NOW  START TAKING TOPROL XL (METPROLOL SUCCINATE) 25 MG BY MOUTH DAILY  If you need a refill on your cardiac medications before your next appointment, please call your pharmacy.     Testing/Procedures:  Your physician has recommended that you wear an event monitor. Event monitors are medical devices that record the heart's electrical activity. Doctors most often  [place] these monitors to diagnose arrhythmias. Arrhythmias are problems with the speed or rhythm of the heartbeat. The monitor is a small, portable device. You can wear one while you do your normal daily activities. This is usually used to diagnose what is causing palpitations/syncope (passing out).  Your physician has requested that you have an echocardiogram. Echocardiography is a painless test that uses sound waves to create images of your heart. It provides your doctor with information about the size and shape of your heart and how well your heart's chambers and valves are working. This procedure takes approximately one hour. There are no restrictions for this procedure.    Follow-Up:  6 WEEKS WITH DAYNA DUNN PA-C IN THE OFFICE      Signed, Fransico Him, MD  11/09/2018 9:41 PM    [facility] [address], [place], [place]  [address] Phone: [phone]; Fax: [phone]

## 2018-11-05 NOTE — Patient Instructions (Signed)
Medication Instructions:   STOP TAKING PROPRANOLOL NOW  START TAKING TOPROL XL (METPROLOL SUCCINATE) 25 MG BY MOUTH DAILY  If you need a refill on your cardiac medications before your next appointment, please call your pharmacy.     Testing/Procedures:  Your physician has recommended that you wear an event monitor. Event monitors are medical devices that record the heart's electrical activity. Doctors most often [place] these monitors to diagnose arrhythmias. Arrhythmias are problems with the speed or rhythm of the heartbeat. The monitor is a small, portable device. You can wear one while you do your normal daily activities. This is usually used to diagnose what is causing palpitations/syncope (passing out).  Your physician has requested that you have an echocardiogram. Echocardiography is a painless test that uses sound waves to create images of your heart. It provides your doctor with information about the size and shape of your heart and how well your heart's chambers and valves are working. This procedure takes approximately one hour. There are no restrictions for this procedure.    Follow-Up:  6 WEEKS WITH DAYNA DUNN PA-C IN THE OFFICE

## 2018-11-11 ENCOUNTER — Ambulatory Visit (HOSPITAL_COMMUNITY): Payer: BC Managed Care – PPO | Attending: Cardiology

## 2018-11-11 ENCOUNTER — Other Ambulatory Visit: Payer: Self-pay

## 2018-11-11 ENCOUNTER — Telehealth: Payer: Self-pay

## 2018-11-11 DIAGNOSIS — I493 Ventricular premature depolarization: Secondary | ICD-10-CM | POA: Insufficient documentation

## 2018-11-11 DIAGNOSIS — R002 Palpitations: Secondary | ICD-10-CM | POA: Diagnosis not present

## 2018-11-11 NOTE — Telephone Encounter (Signed)
Pt called re: her echo results

## 2018-11-11 NOTE — Telephone Encounter (Signed)
LMTCB

## 2018-11-11 NOTE — Telephone Encounter (Signed)
Follow up   Patient is returning your call about echo results. Please call.

## 2018-11-11 NOTE — Telephone Encounter (Signed)
-----   Message from Sueanne Margarita, MD sent at 11/11/2018 11:22 AM EDT ----- Echo showed normal LVF with mild MR

## 2018-11-12 ENCOUNTER — Telehealth: Payer: Self-pay | Admitting: *Deleted

## 2018-11-12 ENCOUNTER — Other Ambulatory Visit: Payer: Self-pay | Admitting: *Deleted

## 2018-11-12 DIAGNOSIS — R002 Palpitations: Secondary | ICD-10-CM

## 2018-11-12 DIAGNOSIS — R42 Dizziness and giddiness: Secondary | ICD-10-CM

## 2018-11-12 NOTE — Telephone Encounter (Signed)
Patient declined cardiac event monitor.

## 2018-12-04 NOTE — Progress Notes (Deleted)
CARDIOLOGY OFFICE NOTE  Date:  12/04/2018    Michelle Hanson Date of Birth: Sep 04, 1965 Medical Record [id number]  PCP:  Patient, No Pcp Per  Cardiologist:  Servando Snare & ***    No chief complaint on file.   History of Present Illness: Michelle Hanson is a 53 y.o. female who presents today for a *** This is a 53yo female with no prior medical hx who recently was seen by her PCP complaining of palpitations.  She recently started having palpitations mainly at night where her heart would be "beating out of my chest" associated with SOB. She has had several episodes of this and one episode resulted in EMS being called and she was found to have an elevated BP at 202/52mmHg and HR 84bpm.  EKG in ER showed NSR with PVCs. She tells me that she has have a lot of anxiety going to work recently due to [employer] and was placed on Lorazepam and Propranolol.  She also had been having episodic pressure in her chest with the palpitations.  She does a lot of strenuous work at her job and notices significant SOB after carrying things up 3 flights of stairs.   She denies any PND, orthopnea, LE edema or syncope. She will have some dizziness with the palpitations. She is compliant with her meds and is tolerating meds with no SE.   The patient {does/does not:200015} have symptoms concerning for COVID-19 infection (fever, chills, cough, or new shortness of breath).   Comes in today. Here with   Past Medical History:  Diagnosis Date  . Anxiety     Past Surgical History:  Procedure Laterality Date  . TUBAL LIGATION       Medications: No outpatient medications have been marked as taking for the 12/10/18 encounter (Appointment) with Burtis Junes, NP.     Allergies: No Known Allergies  Social History: The patient  reports that she has never smoked. She has never used smokeless tobacco. She reports that she does not drink alcohol or use drugs.   Family History: The patient's ***family history is not on  file.   Review of Systems: Please see the history of present illness.   All other systems are reviewed and negative.   Physical Exam: VS:  There were no vitals taken for this visit. Marland Kitchen  BMI There is no height or weight on file to calculate BMI.  Wt Readings from Last 3 Encounters:  11/05/18 187 lb 3.2 oz (84.9 kg)  05/16/12 168 lb (76.2 kg)  07/24/11 158 lb 6.4 oz (71.8 kg)    General: Pleasant. Well developed, well nourished and in no acute distress.   HEENT: Normal.  Neck: Supple, no JVD, carotid bruits, or masses noted.  Cardiac: ***Regular rate and rhythm. No murmurs, rubs, or gallops. No edema.  Respiratory:  Lungs are clear to auscultation bilaterally with normal work of breathing.  GI: Soft and nontender.  MS: No deformity or atrophy. Gait and ROM intact.  Skin: Warm and dry. Color is normal.  Neuro:  Strength and sensation are intact and no gross focal deficits noted.  Psych: Alert, appropriate and with normal affect.   LABORATORY DATA:  EKG:  EKG {ACTION; IS/IS [id number] ordered today. This demonstrates ***.  No results found for: WBC, HGB, HCT, PLT, GLUCOSE, CHOL, TRIG, HDL, LDLDIRECT, LDLCALC, ALT, AST, NA, K, CL, CREATININE, BUN, CO2, TSH, PSA, INR, GLUF, HGBA1C, MICROALBUR   BNP (last 3 results) No results for input(s): BNP in  the last 8760 hours.  ProBNP (last 3 results) No results for input(s): PROBNP in the last 8760 hours.   Other Studies Reviewed Today:   Assessment/Plan: 1. Palpitations -EKG at [facility] ER showed PVCs but she describes episodes of very fast tachycardia and has a fm hx of afib in her mother, sister and brother -I will get an event monitor to assess PVC load and rule out PAF -stop Propranolol and start Toprol XL 25mg  daily  2.  Elevated BP -her BP was elevated in ER ? Related to anxiety -BP stable today -starting Toprol which should help  3.  SOB -this occurs after walking up multiple steps and may be due to  deconditioning -check 2D echo to assess LVF  4.  Atypical CP -occurs with palpitations -likely related to PVCs -she has no fm hx of CAD and has never smoked -check 2D echo   . COVID-19 Education: The signs and symptoms of COVID-19 were discussed with the patient and how to seek care for testing (follow up with PCP or arrange E-visit).  The importance of social distancing, staying at home, hand hygiene and wearing a mask when out in public were discussed today.  Current medicines are reviewed with the patient today.  The patient does not have concerns regarding medicines other than what has been noted above.  The following changes have been made:  See above.  Labs/ tests ordered today include:   No orders of the defined types were placed in this encounter.    Disposition:   FU with *** in {gen number [id number] {Days to years:10300}.   Patient is agreeable to this plan and will call if any problems develop in the interim.   SignedTruitt Merle, NP  12/04/2018 6:14 PM  [facility] [address] [place], [place]  [address] Phone: [phone] Fax: [phone]

## 2018-12-05 DIAGNOSIS — Z01419 Encounter for gynecological examination (general) (routine) without abnormal findings: Secondary | ICD-10-CM | POA: Diagnosis not present

## 2018-12-05 DIAGNOSIS — Z0000 Encounter for general adult medical examination without abnormal findings: Secondary | ICD-10-CM | POA: Diagnosis not present

## 2018-12-05 DIAGNOSIS — R232 Flushing: Secondary | ICD-10-CM | POA: Diagnosis not present

## 2018-12-05 DIAGNOSIS — Z803 Family history of malignant neoplasm of breast: Secondary | ICD-10-CM | POA: Diagnosis not present

## 2018-12-05 DIAGNOSIS — Z800 Family history of malignant neoplasm of digestive organs: Secondary | ICD-10-CM | POA: Diagnosis not present

## 2018-12-05 DIAGNOSIS — Z6832 Body mass index (BMI) 32.0-32.9, adult: Secondary | ICD-10-CM | POA: Diagnosis not present

## 2018-12-05 DIAGNOSIS — D259 Leiomyoma of uterus, unspecified: Secondary | ICD-10-CM | POA: Diagnosis not present

## 2018-12-05 DIAGNOSIS — Z130 Encounter for screening for diseases of the blood and blood-forming organs and certain disorders involving the immune mechanism: Secondary | ICD-10-CM | POA: Diagnosis not present

## 2018-12-05 DIAGNOSIS — N3941 Urge incontinence: Secondary | ICD-10-CM | POA: Diagnosis not present

## 2018-12-05 DIAGNOSIS — Z1231 Encounter for screening mammogram for malignant neoplasm of breast: Secondary | ICD-10-CM | POA: Diagnosis not present

## 2018-12-10 ENCOUNTER — Ambulatory Visit: Payer: BC Managed Care – PPO | Admitting: Physician Assistant

## 2018-12-10 ENCOUNTER — Ambulatory Visit: Payer: BC Managed Care – PPO | Admitting: Nurse Practitioner

## 2019-01-14 NOTE — Progress Notes (Signed)
CARDIOLOGY OFFICE NOTE  Date:  01/19/2019    [name] Date of Birth: [birth year] Medical Record [id number]  PCP:  Shirline Frees, MD  Cardiologist:  Radford Pax  Chief Complaint  Patient presents with  . Follow-up    Seen for Dr. Radford Pax.     History of Present Illness: Michelle Hanson is a 53 y.o. female who presents today for a follow up visit. Seen for Dr. Radford Pax.   She was referred here back in October for palpitations - one episode had resulted in calling EMS - found to have elevated BP at 202/18mmHg and HR 84bpm.  EKG in ER showed NSR with PVCs. She has had significant anxiety with COVID - placed on Lorazepam and Inderal. Endorsed DOE as well. She was changed over to Toprol and had a monitor and echo arranged. Echo was completed. She declined the monitor.   The patient does not have symptoms concerning for COVID-19 infection (fever, chills, cough, or new shortness of breath).   Comes in today. Here alone. She stopped her Toprol - admits she does not like taking medicines. She notes the return of her palpitations. This coincides with going back to coffee - she likes [employer]. Could not afford the monitor. Her echo was stable. She has trouble wearing her mask at work - makes her anxiety worse.   Past Medical History:  Diagnosis Date  . Anxiety     Past Surgical History:  Procedure Laterality Date  . TUBAL LIGATION       Medications: Current Meds  Medication Sig  . ibuprofen (ADVIL,MOTRIN) 800 MG tablet Take 1 tablet (800 mg total) by mouth 3 (three) times daily.  Marland Kitchen LORazepam (ATIVAN) 0.5 MG tablet TAKE 1 TABLET BY MOUTH ONCE DAILY AS NEEDED FOR ANXIETY  . metoprolol succinate (TOPROL XL) 25 MG 24 hr tablet Take 1 tablet (25 mg total) by mouth daily.  . Multiple Vitamin (MULTIVITAMIN) tablet Take 1 tablet by mouth daily.  . [DISCONTINUED] metoprolol succinate (TOPROL XL) 25 MG 24 hr tablet Take 1 tablet (25 mg total) by mouth daily.     Allergies: No Known  Allergies  Social History: The patient  reports that she has never smoked. She has never used smokeless tobacco. She reports that she does not drink alcohol or use drugs.   Family History: The patient's family history is not on file.   Review of Systems: Please see the history of present illness.   All other systems are reviewed and negative.   Physical Exam: VS:  BP 124/80   Pulse 75   Ht 5' 4.5" (1.638 m)   Wt 185 lb (83.9 kg)   SpO2 96%   BMI 31.26 kg/m  .  BMI Body mass index is 31.26 kg/m.  Wt Readings from Last 3 Encounters:  01/19/19 185 lb (83.9 kg)  11/05/18 187 lb 3.2 oz (84.9 kg)  05/16/12 168 lb (76.2 kg)    General: Pleasant. Well developed, well nourished and in no acute distress.   HEENT: Normal.  Neck: Supple, no JVD, carotid bruits, or masses noted.  Cardiac: Regular rate and rhythm. No murmurs, rubs, or gallops. No edema.  Respiratory:  Lungs are clear to auscultation bilaterally with normal work of breathing.  GI: Soft and nontender.  MS: No deformity or atrophy. Gait and ROM intact.  Skin: Warm and dry. Color is normal.  Neuro:  Strength and sensation are intact and no gross focal deficits noted.  Psych: Alert, appropriate and  with normal affect.   LABORATORY DATA:  EKG:  EKG is not ordered today.   No results found for: WBC, HGB, HCT, PLT, GLUCOSE, CHOL, TRIG, HDL, LDLDIRECT, LDLCALC, ALT, AST, NA, K, CL, CREATININE, BUN, CO2, TSH, PSA, INR, GLUF, HGBA1C, MICROALBUR      BNP (last 3 results) No results for input(s): BNP in the last 8760 hours.  ProBNP (last 3 results) No results for input(s): PROBNP in the last 8760 hours.   Other Studies Reviewed Today:  ECHO STUDY 10/2018   1. Left ventricular ejection fraction, by visual estimation, is 60 to 65%. The left ventricle has normal function. Normal left ventricular size. There is no left ventricular hypertrophy.  2. Global right ventricle has normal systolic function.The right  ventricular size is normal. No increase in right ventricular wall thickness.  3. Left atrial size was normal.  4. Right atrial size was normal.  5. The mitral valve is normal in structure. Mild mitral valve regurgitation. No evidence of mitral stenosis.  6. The tricuspid valve is normal in structure. Tricuspid valve regurgitation is trivial.  7. The aortic valve is normal in structure. Aortic valve regurgitation was not visualized by color flow Doppler. Structurally normal aortic valve, with no evidence of sclerosis or stenosis.  8. The pulmonic valve was normal in structure. Pulmonic valve regurgitation is not visualized by color flow Doppler.  9. Normal pulmonary artery systolic pressure. 10. The inferior vena cava is normal in size with greater than 50% respiratory variability, suggesting right atrial pressure of 3 mmHg.   ASSESSMENT & PLAN:    1. Palpitations - she is not able to afford the monitor - I told her about the [employer] option she would maybe afford. Restart Toprol per her request. Needs to get back off caffeine. Last TSH was normal. Echo is stable and reassuring.   2. Anxiety - on prn Ativan - asking for refill - will defer to PCP  3. PVCs - see #1.   4. Prior elevated BP - BP good here today. Would follow.   5. DOE - probably multifactorial.   6. Obesity - discussed at length.   7. COVID-19 Education: The signs and symptoms of COVID-19 were discussed with the patient and how to seek care for testing (follow up with PCP or arrange E-visit).  The importance of social distancing, staying at home, hand hygiene and wearing a mask when out in public were discussed today.  Current medicines are reviewed with the patient today.  The patient does not have concerns regarding medicines other than what has been noted above.  The following changes have been made:  See above.  Labs/ tests ordered today include:   No orders of the defined types were placed in this  encounter.    Disposition:   FU with Dr. Radford Pax in 4 to 6 months.    Patient is agreeable to this plan and will call if any problems develop in the interim.   SignedTruitt Merle, NP  01/19/2019 9:54 AM  [facility] [address] [place], [place]  [address] Phone: [phone] Fax: [phone]

## 2019-01-19 ENCOUNTER — Encounter: Payer: Self-pay | Admitting: Nurse Practitioner

## 2019-01-19 ENCOUNTER — Other Ambulatory Visit: Payer: Self-pay

## 2019-01-19 ENCOUNTER — Ambulatory Visit (INDEPENDENT_AMBULATORY_CARE_PROVIDER_SITE_OTHER): Payer: BC Managed Care – PPO | Admitting: Nurse Practitioner

## 2019-01-19 VITALS — BP 124/80 | HR 75 | Ht 64.5 in | Wt 185.0 lb

## 2019-01-19 DIAGNOSIS — F419 Anxiety disorder, unspecified: Secondary | ICD-10-CM

## 2019-01-19 DIAGNOSIS — R0600 Dyspnea, unspecified: Secondary | ICD-10-CM | POA: Diagnosis not present

## 2019-01-19 DIAGNOSIS — R002 Palpitations: Secondary | ICD-10-CM | POA: Diagnosis not present

## 2019-01-19 DIAGNOSIS — I493 Ventricular premature depolarization: Secondary | ICD-10-CM

## 2019-01-19 DIAGNOSIS — R0609 Other forms of dyspnea: Secondary | ICD-10-CM

## 2019-01-19 DIAGNOSIS — Z7189 Other specified counseling: Secondary | ICD-10-CM

## 2019-01-19 MED ORDER — METOPROLOL SUCCINATE ER 25 MG PO TB24: 25.0000 mg | ORAL_TABLET | Freq: Every day | ORAL | 3 refills | Status: DC

## 2019-01-19 NOTE — Patient Instructions (Addendum)
After Visit Summary:  We will be checking the following labs today -  NONE   Medication Instructions:    Continue with your current medicines.   I am resending your RX for the Toprol back in.    If you need a refill on your cardiac medications before your next appointment, please call your pharmacy.     Testing/Procedures To Be Arranged:  N/A  Follow-Up:   See Dr. Radford Pax in about 4 to 6 months.  You will receive a reminder letter in the mail two months in advance. If you don't receive a letter, please call our office to schedule the follow-up appointment.     At [facility], you and your health needs are our priority.  As part of our continuing mission to provide you with exceptional heart care, we have created designated Provider Care Teams.  These Care Teams include your primary Cardiologist (physician) and Advanced Practice Providers (APPs -  Physician Assistants and Nurse Practitioners) who all work together to provide you with the care you need, when you need it.  Special Instructions:  . Stay safe, stay home, wash your hands for at least 20 seconds and wear a mask when out in public.  . It was good to talk with you today.  Marland Kitchen You can look into the "[place]" device - on [employer] the [place] office at [phone] if you have any questions, problems or concerns.

## 2019-03-04 ENCOUNTER — Other Ambulatory Visit: Payer: Self-pay | Admitting: Obstetrics and Gynecology

## 2019-03-04 DIAGNOSIS — Z803 Family history of malignant neoplasm of breast: Secondary | ICD-10-CM

## 2019-04-23 ENCOUNTER — Ambulatory Visit: Payer: BC Managed Care – PPO

## 2019-04-23 ENCOUNTER — Ambulatory Visit: Payer: BC Managed Care – PPO | Attending: Internal Medicine

## 2019-04-23 DIAGNOSIS — Z23 Encounter for immunization: Secondary | ICD-10-CM

## 2019-04-23 NOTE — Progress Notes (Signed)
   Covid-19 Vaccination Clinic  Name:  Michelle Hanson    MRN: [id number] DOB: May 13, 1965  04/23/2019  [name] was observed post Covid-19 immunization for 15 minutes without incident. She was provided with Vaccine Information Sheet and instruction to access the V-Safe system.   [name] was instructed to call 911 with any severe reactions post vaccine: Marland Kitchen Difficulty breathing  . Swelling of face and throat  . A fast heartbeat  . A bad rash all over body  . Dizziness and weakness   Immunizations Administered    Name Date Dose VIS Date Route   Pfizer COVID-19 Vaccine 04/23/2019 10:16 AM 0.3 mL 01/09/2019 Intramuscular   Manufacturer: [place]   Lot: [id number]   [place]: [id number]

## 2019-05-18 ENCOUNTER — Ambulatory Visit: Payer: BC Managed Care – PPO | Attending: Internal Medicine

## 2019-05-18 DIAGNOSIS — Z23 Encounter for immunization: Secondary | ICD-10-CM

## 2019-05-18 NOTE — Progress Notes (Signed)
   Covid-19 Vaccination Clinic  Name:  Michelle Hanson    MRN: [id number] DOB: [birth year]  05/18/2019  Michelle Hanson was observed post Covid-19 immunization for 15 minutes without incident. She was provided with Vaccine Information Sheet and instruction to access the V-Safe system.   Michelle Hanson was instructed to call 911 with any severe reactions post vaccine: Marland Kitchen Difficulty breathing  . Swelling of face and throat  . A fast heartbeat  . A bad rash all over body  . Dizziness and weakness   Immunizations Administered    Name Date Dose VIS Date Route   Pfizer COVID-19 Vaccine 05/18/2019  9:38 AM 0.3 mL 03/25/2018 Intramuscular   Manufacturer: [place]   Lot: [id number]   [place]: [id number]

## 2019-07-16 DIAGNOSIS — R079 Chest pain, unspecified: Secondary | ICD-10-CM | POA: Diagnosis not present

## 2019-08-07 ENCOUNTER — Encounter: Payer: Self-pay | Admitting: Family Medicine

## 2019-10-06 ENCOUNTER — Other Ambulatory Visit: Payer: BC Managed Care – PPO

## 2019-10-09 ENCOUNTER — Other Ambulatory Visit: Payer: BC Managed Care – PPO

## 2019-10-09 ENCOUNTER — Other Ambulatory Visit: Payer: Self-pay

## 2019-10-09 DIAGNOSIS — Z20822 Contact with and (suspected) exposure to covid-19: Secondary | ICD-10-CM

## 2019-10-12 LAB — NOVEL CORONAVIRUS, NAA: SARS-CoV-2, NAA: DETECTED — AB

## 2019-11-04 DIAGNOSIS — H25092 Other age-related incipient cataract, left eye: Secondary | ICD-10-CM | POA: Diagnosis not present

## 2019-11-10 DIAGNOSIS — S99922A Unspecified injury of left foot, initial encounter: Secondary | ICD-10-CM | POA: Diagnosis not present

## 2019-11-10 DIAGNOSIS — S90112A Contusion of left great toe without damage to nail, initial encounter: Secondary | ICD-10-CM | POA: Diagnosis not present

## 2019-11-10 DIAGNOSIS — W228XXA Striking against or struck by other objects, initial encounter: Secondary | ICD-10-CM | POA: Diagnosis not present

## 2019-11-10 DIAGNOSIS — Y990 Civilian activity done for income or pay: Secondary | ICD-10-CM | POA: Diagnosis not present

## 2019-11-10 DIAGNOSIS — Z91040 Latex allergy status: Secondary | ICD-10-CM | POA: Diagnosis not present

## 2019-11-10 DIAGNOSIS — Y9289 Other specified places as the place of occurrence of the external cause: Secondary | ICD-10-CM | POA: Diagnosis not present

## 2019-11-11 DIAGNOSIS — R1032 Left lower quadrant pain: Secondary | ICD-10-CM | POA: Diagnosis not present

## 2019-12-14 DIAGNOSIS — Z6834 Body mass index (BMI) 34.0-34.9, adult: Secondary | ICD-10-CM | POA: Diagnosis not present

## 2019-12-14 DIAGNOSIS — Z01419 Encounter for gynecological examination (general) (routine) without abnormal findings: Secondary | ICD-10-CM | POA: Diagnosis not present

## 2019-12-14 DIAGNOSIS — Z1231 Encounter for screening mammogram for malignant neoplasm of breast: Secondary | ICD-10-CM | POA: Diagnosis not present

## 2019-12-16 ENCOUNTER — Other Ambulatory Visit: Payer: Self-pay | Admitting: Obstetrics and Gynecology

## 2019-12-16 DIAGNOSIS — Z803 Family history of malignant neoplasm of breast: Secondary | ICD-10-CM

## 2020-02-01 DIAGNOSIS — R1310 Dysphagia, unspecified: Secondary | ICD-10-CM | POA: Diagnosis not present

## 2020-02-01 DIAGNOSIS — Z1159 Encounter for screening for other viral diseases: Secondary | ICD-10-CM | POA: Diagnosis not present

## 2020-02-01 DIAGNOSIS — R1011 Right upper quadrant pain: Secondary | ICD-10-CM | POA: Diagnosis not present

## 2020-02-01 DIAGNOSIS — K5909 Other constipation: Secondary | ICD-10-CM | POA: Diagnosis not present

## 2020-02-01 DIAGNOSIS — R1013 Epigastric pain: Secondary | ICD-10-CM | POA: Diagnosis not present

## 2020-02-05 DIAGNOSIS — K2970 Gastritis, unspecified, without bleeding: Secondary | ICD-10-CM | POA: Diagnosis not present

## 2020-02-05 DIAGNOSIS — R1013 Epigastric pain: Secondary | ICD-10-CM | POA: Diagnosis not present

## 2020-02-05 DIAGNOSIS — R1310 Dysphagia, unspecified: Secondary | ICD-10-CM | POA: Diagnosis not present

## 2020-02-05 DIAGNOSIS — K319 Disease of stomach and duodenum, unspecified: Secondary | ICD-10-CM | POA: Diagnosis not present

## 2020-02-05 DIAGNOSIS — R1011 Right upper quadrant pain: Secondary | ICD-10-CM | POA: Diagnosis not present

## 2020-02-05 DIAGNOSIS — K3189 Other diseases of stomach and duodenum: Secondary | ICD-10-CM | POA: Diagnosis not present

## 2020-02-05 DIAGNOSIS — K449 Diaphragmatic hernia without obstruction or gangrene: Secondary | ICD-10-CM | POA: Diagnosis not present

## 2020-02-05 DIAGNOSIS — K209 Esophagitis, unspecified without bleeding: Secondary | ICD-10-CM | POA: Diagnosis not present

## 2020-06-10 DIAGNOSIS — M25562 Pain in left knee: Secondary | ICD-10-CM | POA: Diagnosis not present

## 2020-06-10 DIAGNOSIS — M7661 Achilles tendinitis, right leg: Secondary | ICD-10-CM | POA: Diagnosis not present

## 2020-07-04 DIAGNOSIS — M222X2 Patellofemoral disorders, left knee: Secondary | ICD-10-CM | POA: Diagnosis not present

## 2020-07-04 DIAGNOSIS — M25532 Pain in left wrist: Secondary | ICD-10-CM | POA: Diagnosis not present

## 2020-07-05 ENCOUNTER — Other Ambulatory Visit: Payer: Self-pay | Admitting: *Deleted

## 2020-07-05 MED ORDER — METOPROLOL SUCCINATE ER 25 MG PO TB24: 25.0000 mg | ORAL_TABLET | Freq: Every day | ORAL | 0 refills | Status: DC

## 2020-09-26 DIAGNOSIS — Z1211 Encounter for screening for malignant neoplasm of colon: Secondary | ICD-10-CM | POA: Diagnosis not present

## 2020-09-26 DIAGNOSIS — F419 Anxiety disorder, unspecified: Secondary | ICD-10-CM | POA: Diagnosis not present

## 2020-09-26 DIAGNOSIS — K5900 Constipation, unspecified: Secondary | ICD-10-CM | POA: Diagnosis not present

## 2020-09-26 DIAGNOSIS — M545 Low back pain, unspecified: Secondary | ICD-10-CM | POA: Diagnosis not present

## 2020-10-07 DIAGNOSIS — M545 Low back pain, unspecified: Secondary | ICD-10-CM | POA: Diagnosis not present

## 2020-10-21 DIAGNOSIS — M545 Low back pain, unspecified: Secondary | ICD-10-CM | POA: Diagnosis not present

## 2020-11-07 DIAGNOSIS — M545 Low back pain, unspecified: Secondary | ICD-10-CM | POA: Diagnosis not present

## 2020-11-14 ENCOUNTER — Other Ambulatory Visit: Payer: Self-pay | Admitting: Cardiology

## 2020-11-15 DIAGNOSIS — M25562 Pain in left knee: Secondary | ICD-10-CM | POA: Diagnosis not present

## 2020-11-29 DIAGNOSIS — Z630 Problems in relationship with spouse or partner: Secondary | ICD-10-CM | POA: Diagnosis not present

## 2020-11-29 DIAGNOSIS — Z658 Other specified problems related to psychosocial circumstances: Secondary | ICD-10-CM | POA: Diagnosis not present

## 2020-11-29 DIAGNOSIS — F4389 Other reactions to severe stress: Secondary | ICD-10-CM | POA: Diagnosis not present

## 2020-12-02 DIAGNOSIS — M47816 Spondylosis without myelopathy or radiculopathy, lumbar region: Secondary | ICD-10-CM | POA: Diagnosis not present

## 2020-12-05 DIAGNOSIS — Z630 Problems in relationship with spouse or partner: Secondary | ICD-10-CM | POA: Diagnosis not present

## 2020-12-05 DIAGNOSIS — Z658 Other specified problems related to psychosocial circumstances: Secondary | ICD-10-CM | POA: Diagnosis not present

## 2020-12-05 DIAGNOSIS — F4310 Post-traumatic stress disorder, unspecified: Secondary | ICD-10-CM | POA: Diagnosis not present

## 2020-12-12 ENCOUNTER — Other Ambulatory Visit: Payer: Self-pay | Admitting: Family Medicine

## 2020-12-12 ENCOUNTER — Ambulatory Visit
Admission: RE | Admit: 2020-12-12 | Discharge: 2020-12-12 | Disposition: A | Discharge status: Home / Self Care | Payer: BC Managed Care – PPO | Source: Ambulatory Visit | Attending: Family Medicine | Admitting: Family Medicine

## 2020-12-12 ENCOUNTER — Other Ambulatory Visit: Payer: Self-pay

## 2020-12-12 DIAGNOSIS — R3989 Other symptoms and signs involving the genitourinary system: Secondary | ICD-10-CM | POA: Diagnosis not present

## 2020-12-12 DIAGNOSIS — K6389 Other specified diseases of intestine: Secondary | ICD-10-CM | POA: Diagnosis not present

## 2020-12-12 DIAGNOSIS — N852 Hypertrophy of uterus: Secondary | ICD-10-CM | POA: Diagnosis not present

## 2020-12-12 DIAGNOSIS — K429 Umbilical hernia without obstruction or gangrene: Secondary | ICD-10-CM | POA: Diagnosis not present

## 2020-12-12 DIAGNOSIS — R1032 Left lower quadrant pain: Secondary | ICD-10-CM

## 2020-12-12 DIAGNOSIS — K449 Diaphragmatic hernia without obstruction or gangrene: Secondary | ICD-10-CM | POA: Diagnosis not present

## 2020-12-12 MED ORDER — IOPAMIDOL (ISOVUE-300) INJECTION 61%
100.0000 mL | Freq: Once | INTRAVENOUS | Status: AC | PRN
  Administered 2020-12-12: 100 mL via INTRAVENOUS

## 2020-12-13 DIAGNOSIS — D259 Leiomyoma of uterus, unspecified: Secondary | ICD-10-CM | POA: Diagnosis not present

## 2020-12-13 DIAGNOSIS — Z01411 Encounter for gynecological examination (general) (routine) with abnormal findings: Secondary | ICD-10-CM | POA: Diagnosis not present

## 2020-12-13 DIAGNOSIS — Z1231 Encounter for screening mammogram for malignant neoplasm of breast: Secondary | ICD-10-CM | POA: Diagnosis not present

## 2020-12-13 DIAGNOSIS — Z0000 Encounter for general adult medical examination without abnormal findings: Secondary | ICD-10-CM | POA: Diagnosis not present

## 2020-12-13 DIAGNOSIS — Z1329 Encounter for screening for other suspected endocrine disorder: Secondary | ICD-10-CM | POA: Diagnosis not present

## 2020-12-13 DIAGNOSIS — Z6832 Body mass index (BMI) 32.0-32.9, adult: Secondary | ICD-10-CM | POA: Diagnosis not present

## 2020-12-13 DIAGNOSIS — Z124 Encounter for screening for malignant neoplasm of cervix: Secondary | ICD-10-CM | POA: Diagnosis not present

## 2020-12-13 DIAGNOSIS — Z131 Encounter for screening for diabetes mellitus: Secondary | ICD-10-CM | POA: Diagnosis not present

## 2020-12-13 DIAGNOSIS — Z13220 Encounter for screening for lipoid disorders: Secondary | ICD-10-CM | POA: Diagnosis not present

## 2020-12-13 DIAGNOSIS — Z01419 Encounter for gynecological examination (general) (routine) without abnormal findings: Secondary | ICD-10-CM | POA: Diagnosis not present

## 2020-12-16 DIAGNOSIS — N8500 Endometrial hyperplasia, unspecified: Secondary | ICD-10-CM | POA: Diagnosis not present

## 2020-12-16 DIAGNOSIS — R1032 Left lower quadrant pain: Secondary | ICD-10-CM | POA: Diagnosis not present

## 2020-12-16 DIAGNOSIS — D259 Leiomyoma of uterus, unspecified: Secondary | ICD-10-CM | POA: Diagnosis not present

## 2020-12-20 ENCOUNTER — Ambulatory Visit
Admission: RE | Admit: 2020-12-20 | Discharge: 2020-12-20 | Disposition: A | Discharge status: Home / Self Care | Payer: BC Managed Care – PPO | Source: Ambulatory Visit | Attending: Family Medicine | Admitting: Family Medicine

## 2020-12-20 ENCOUNTER — Other Ambulatory Visit: Payer: Self-pay | Admitting: Family Medicine

## 2020-12-20 ENCOUNTER — Other Ambulatory Visit: Payer: Self-pay

## 2020-12-20 DIAGNOSIS — R079 Chest pain, unspecified: Secondary | ICD-10-CM | POA: Diagnosis not present

## 2020-12-20 DIAGNOSIS — R0789 Other chest pain: Secondary | ICD-10-CM

## 2020-12-26 DIAGNOSIS — L293 Anogenital pruritus, unspecified: Secondary | ICD-10-CM | POA: Diagnosis not present

## 2020-12-26 DIAGNOSIS — D259 Leiomyoma of uterus, unspecified: Secondary | ICD-10-CM | POA: Diagnosis not present

## 2020-12-27 ENCOUNTER — Other Ambulatory Visit (HOSPITAL_COMMUNITY): Payer: Self-pay | Admitting: Family Medicine

## 2020-12-27 ENCOUNTER — Other Ambulatory Visit: Payer: Self-pay | Admitting: Family Medicine

## 2020-12-27 DIAGNOSIS — R109 Unspecified abdominal pain: Secondary | ICD-10-CM

## 2020-12-29 ENCOUNTER — Other Ambulatory Visit: Payer: Self-pay | Admitting: Obstetrics & Gynecology

## 2020-12-29 DIAGNOSIS — D259 Leiomyoma of uterus, unspecified: Secondary | ICD-10-CM

## 2021-01-05 ENCOUNTER — Ambulatory Visit (HOSPITAL_COMMUNITY)
Admission: RE | Admit: 2021-01-05 | Discharge: 2021-01-05 | Disposition: A | Discharge status: Home / Self Care | Payer: BC Managed Care – PPO | Source: Ambulatory Visit | Attending: Family Medicine | Admitting: Family Medicine

## 2021-01-05 ENCOUNTER — Other Ambulatory Visit: Payer: Self-pay

## 2021-01-05 DIAGNOSIS — R109 Unspecified abdominal pain: Secondary | ICD-10-CM | POA: Diagnosis not present

## 2021-01-05 DIAGNOSIS — K760 Fatty (change of) liver, not elsewhere classified: Secondary | ICD-10-CM | POA: Diagnosis not present

## 2021-01-05 MED ORDER — TECHNETIUM TC 99M MEBROFENIN IV KIT
4.8500 | PACK | Freq: Once | INTRAVENOUS | Status: AC | PRN
  Administered 2021-01-05: 4.85 via INTRAVENOUS

## 2021-01-09 DIAGNOSIS — M6281 Muscle weakness (generalized): Secondary | ICD-10-CM | POA: Diagnosis not present

## 2021-01-09 DIAGNOSIS — M47896 Other spondylosis, lumbar region: Secondary | ICD-10-CM | POA: Diagnosis not present

## 2021-01-09 DIAGNOSIS — M5416 Radiculopathy, lumbar region: Secondary | ICD-10-CM | POA: Diagnosis not present

## 2021-02-02 ENCOUNTER — Other Ambulatory Visit: Payer: BC Managed Care – PPO

## 2021-02-10 ENCOUNTER — Encounter: Payer: Self-pay | Admitting: *Deleted

## 2021-02-10 ENCOUNTER — Other Ambulatory Visit: Payer: Self-pay

## 2021-02-10 ENCOUNTER — Ambulatory Visit
Admission: RE | Admit: 2021-02-10 | Discharge: 2021-02-10 | Disposition: A | Discharge status: Home / Self Care | Payer: BC Managed Care – PPO | Source: Ambulatory Visit | Attending: Obstetrics & Gynecology | Admitting: Obstetrics & Gynecology

## 2021-02-10 DIAGNOSIS — D259 Leiomyoma of uterus, unspecified: Secondary | ICD-10-CM

## 2021-02-10 HISTORY — PX: IR RADIOLOGIST EVAL & MGMT: IMG5224

## 2021-02-10 NOTE — H&P (Signed)
Chief Complaint: Patient was seen in consultation today for pelvic discomfort at the request of [place]  Referring Physician(s): Mody,Vaishali  History of Present Illness: Michelle Hanson is a 56 y.o. female w PMHx only significant for anxiety with palpitations and obesity. Pt reports that she is post menopausal and that her LMP was "years ago" but she still experiences severe cramping. Prior to her menses cessation, she endorses dysfunctional uterine bleeding which was treated with endometrial ablation with success.  Her most recent discomfort was in late October 2022 when she experienced severe left-sided pain, for which a CT was obtained. This demonstrated an enlarged fibroid uterus and peri-uterine veins. She saw her GYN, Dr. Benjie Karvonen and had a negative EMB on 12/26/20, after which she was referred to VIR for evaluation for potential embolization. ROS otherwise negative.  Review of Systems:  A 12 point ROS discussed and pertinent positives are indicated in the HPI above.    Past Medical History:  Diagnosis Date   Anxiety     Past Surgical History:  Procedure Laterality Date   IR RADIOLOGIST EVAL & MGMT  02/10/2021   TUBAL LIGATION      Allergies: Patient has no known allergies.  Medications: Prior to Admission medications   Medication Sig Start Date End Date Taking? Authorizing Provider  ibuprofen (ADVIL,MOTRIN) 800 MG tablet Take 1 tablet (800 mg total) by mouth 3 (three) times daily. 05/16/12   Charlann Lange, PA-C  LORazepam (ATIVAN) 0.5 MG tablet TAKE 1 TABLET BY MOUTH ONCE DAILY AS NEEDED FOR ANXIETY 10/27/18   [provider]  metoprolol succinate (TOPROL-XL) 25 MG 24 hr tablet Take 1 tablet (25 mg total) by mouth daily. Patient needs appointment for future refills. Please call office at [phone] to schedule appointment. 2nd attempt. 11/14/20   Sueanne Margarita, MD  Multiple Vitamin (MULTIVITAMIN) tablet Take 1 tablet by mouth daily.    [provider]     No family history on file.  Social History   Socioeconomic History   Marital status: Divorced    Spouse name: Not on file   Number of children: Not on file   Years of education: Not on file   Highest education level: Not on file  Occupational History   Not on file  Tobacco Use   Smoking status: Never   Smokeless tobacco: Never  Substance and Sexual Activity   Alcohol use: No   Drug use: No   Sexual activity: Yes    Birth control/protection: Injection  Other Topics Concern   Not on file  Social History Narrative   Not on file   Social Determinants of Health   Financial Resource Strain: Not on file  Food Insecurity: Not on file  Transportation Needs: Not on file  Physical Activity: Not on file  Stress: Not on file  Social Connections: Not on file    Review of Systems As above  Vital Signs: LMP 01/03/2011   Physical Exam  Deferred, secondary to virtual visit.  Imaging:  CT AP: 12/12/20 Imaging independently reviewed, demonstrating an enlarged uterus with a dominant 9 cm fibroid.    Labs:  CBC: No results for input(s): WBC, HGB, HCT, PLT in the last 8760 hours.  COAGS: No results for input(s): INR, APTT in the last 8760 hours.  BMP: No results for input(s): NA, K, CL, CO2, GLUCOSE, BUN, CALCIUM, CREATININE, GFRNONAA, GFRAA in the last 8760 hours.  Invalid input(s): CMP   Assessment and Plan:  Assessment   Plan: Ms.Delanna A Clapham is a 56 y.o. year old female who presents with symptomatic uterine fibroids and pelvic discomfort.  She is interested in pursuing a minimally-invasive option for the treatment of her fibroids at this time, and is curious about Uterine Artery Embolization. She is post-menopausal, and thus no concern for future pregnancies. Discussed risk of ovarian failure.  Review of systems is otherwise negative.  The procedure has been fully reviewed with the patient/patients authorized representative. The risks,  benefits and alternatives have been explained, and the patient/patients authorized representative has consented to the procedure.  *CT AP (12/12/20) already performed and reviewed. No additional imaging required. *Proceed to schedule based on mutual availability. *Will plan for left trans-radial access. *Same day procedure, no overnight admission. *CBC, BMP and Coags to be drawn on day of procedure.   Michaelle Birks, MD [facility]   Pager. [phone] Clinic. [phone]  Thank you for this interesting consult.  I greatly enjoyed meeting ANNSLEY AKKERMAN and look forward to participating in their care.  A copy of this report was sent to the requesting provider on this date.  I spent a total of  30 Minutes  in virtual clinical consultation, more than half of this visit was spent counseling the patient and/or their family regarding proposed plan of care, outcomes, expectations and duration of illness.

## 2021-02-17 ENCOUNTER — Other Ambulatory Visit: Payer: Self-pay | Admitting: Obstetrics and Gynecology

## 2021-02-17 DIAGNOSIS — Z803 Family history of malignant neoplasm of breast: Secondary | ICD-10-CM

## 2021-02-22 ENCOUNTER — Telehealth: Payer: Self-pay | Admitting: Cardiology

## 2021-02-22 NOTE — Telephone Encounter (Signed)
Spoke with the patient who reports that yesterday her heart started racing. She report her heart rate got up to 168. She was able to sit down and rest and her heart rate came down to 90. She reports that her BP was 168/89. 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 She reports that she had pain on her left side when she took deep breaths. She also reports some lightheadedness. She states that she talked to her PCP and they told her to go to the ER. Patient refused to go to the ER and still states she is not going to go. She states that today she is feeling better. She denies any palpitations today. She denies chest pain although she states that she did eat recently and she is having some heartburn.  She has not been taking her metoprolol. She states that she did start back on it today. She is currently on her way to work and they have a nurse onsite that she is going to go see.

## 2021-02-22 NOTE — Telephone Encounter (Signed)
° °  Pt c/o of Chest Pain: STAT if CP now or developed within 24 hours  1. Are you having CP right now? No   2. Are you experiencing any other symptoms (ex. SOB, nausea, vomiting, sweating)? SOB  3. How long have you been experiencing CP? 2 days ago  4. Is your CP continuous or coming and going? Coming and going   5. Have you taken Nitroglycerin? No  Pt said 2 days ago she felt CP then yesterday she felt her heart beats really really fast and having SOB, she made an appt with Dr. Radford Pax on 04/07 ?

## 2021-02-22 NOTE — Telephone Encounter (Signed)
Patient called to inform us that she tested positive for COVID on January 10th.

## 2021-02-22 NOTE — Telephone Encounter (Signed)
Spoke with the patient and advised her on recommendations from Dr. Radford Pax. Patient verbalized understanding and is in agreement to go.

## 2021-03-01 ENCOUNTER — Other Ambulatory Visit (HOSPITAL_COMMUNITY): Payer: Self-pay | Admitting: Interventional Radiology

## 2021-03-01 DIAGNOSIS — D259 Leiomyoma of uterus, unspecified: Secondary | ICD-10-CM

## 2021-03-03 ENCOUNTER — Other Ambulatory Visit (HOSPITAL_COMMUNITY): Payer: Self-pay | Admitting: Interventional Radiology

## 2021-03-13 ENCOUNTER — Other Ambulatory Visit: Payer: BC Managed Care – PPO

## 2021-03-27 DIAGNOSIS — E7800 Pure hypercholesterolemia, unspecified: Secondary | ICD-10-CM | POA: Diagnosis not present

## 2021-03-27 DIAGNOSIS — R0683 Snoring: Secondary | ICD-10-CM | POA: Diagnosis not present

## 2021-03-30 DIAGNOSIS — M25562 Pain in left knee: Secondary | ICD-10-CM | POA: Diagnosis not present

## 2021-03-30 DIAGNOSIS — M1712 Unilateral primary osteoarthritis, left knee: Secondary | ICD-10-CM | POA: Diagnosis not present

## 2021-04-17 DIAGNOSIS — M545 Low back pain, unspecified: Secondary | ICD-10-CM | POA: Diagnosis not present

## 2021-04-28 DIAGNOSIS — Z0000 Encounter for general adult medical examination without abnormal findings: Secondary | ICD-10-CM | POA: Diagnosis not present

## 2021-04-28 DIAGNOSIS — Z23 Encounter for immunization: Secondary | ICD-10-CM | POA: Diagnosis not present

## 2021-05-04 DIAGNOSIS — D259 Leiomyoma of uterus, unspecified: Secondary | ICD-10-CM | POA: Diagnosis not present

## 2021-05-04 DIAGNOSIS — R1084 Generalized abdominal pain: Secondary | ICD-10-CM | POA: Diagnosis not present

## 2021-05-04 NOTE — Progress Notes (Signed)
? ?Cardiology Office Note   ? ?Date:  05/05/2021  ? ?ID:  Michelle Hanson, DOB Apr 25, 1965, MRN [id number] ? ?PCP:  Shirline Frees, MD  ?Cardiologist:  Fransico Him, MD  ? ?Chief Complaint  ?Patient presents with  ? Palpitations  ? Hypertension  ? ? ?History of Present Illness:  ?Michelle Hanson is a 56 y.o. female with no prior medical hx who was referred a few years ago for evaluation of palpitations.  EKG in the ER at that time showed PVCs.  She was having a lot of anxiety going to work recently due to [place] and at that time had been placed on lorazepam and propranolol.  She also at that time was having episodic chest pain as well and her blood pressures been poorly controlled.  She was having shortness of breath when going up multiple steps but was felt to likely be due to deconditioning.  Her propranolol was stopped and she was placed on Toprol-XL 25 mg daily. ? ?2D echo done 11/11/2018 showed normal LV function with EF 60 to 65% with mild MR.  An event monitor was ordered but this was never followed through with.  She was seen back by Truitt Merle on 01/19/2019.  She had stopped her Toprol and was not taking any of her medicines.  After stopping Toprol her palpitations started back which she attributed to starting back going to get coffee at [facility].  She says she could not afford the monitor.  Lori recommended an Alive[employer] monitor if she could afford it.  Her Toprol was restarted.  She was encouraged that she stay off caffeine. ? ?She is here today for followup and is doing well but is still having a lot of palpitations and occasionally during the day.  She still has occasional chest pain and had an episode of CP while driving and had to pull over.  She still occasionally will have some DOE when going up and down stairs. She denies any PND, orthopnea, LE edema (except when standing too long) or syncope.  ?Past Medical History:  ?Diagnosis Date  ? Anxiety   ? ? ?Past Surgical History:  ?Procedure Laterality Date   ? IR RADIOLOGIST EVAL & MGMT  02/10/2021  ? TUBAL LIGATION    ? ? ?Current Medications: ?Current Meds  ?Medication Sig  ? ibuprofen (ADVIL,MOTRIN) 800 MG tablet Take 1 tablet (800 mg total) by mouth 3 (three) times daily.  ? LINZESS 72 MCG capsule Take 72 mcg by mouth every morning.  ? LORazepam (ATIVAN) 0.5 MG tablet TAKE 1 TABLET BY MOUTH ONCE DAILY AS NEEDED FOR ANXIETY  ? metoprolol succinate (TOPROL-XL) 25 MG 24 hr tablet Take 1 tablet (25 mg total) by mouth daily. Patient needs appointment for future refills. Please call office at [phone] to schedule appointment. 2nd attempt.  ? Multiple Vitamin (MULTIVITAMIN) tablet Take 1 tablet by mouth daily.  ? nystatin-triamcinolone ointment (MYCOLOG) Apply topically as needed.  ? pantoprazole (PROTONIX) 40 MG tablet Take 40 mg by mouth daily at 6 (six) AM.  ? ? ?Allergies:   Latex  ? ?Social History  ? ?Socioeconomic History  ? Marital status: Divorced  ?  Spouse name: Not on file  ? Number of children: Not on file  ? Years of education: Not on file  ? Highest education level: Not on file  ?Occupational History  ? Not on file  ?Tobacco Use  ? Smoking status: Never  ? Smokeless tobacco: Never  ?Substance and Sexual Activity  ?  Alcohol use: No  ? Drug use: No  ? Sexual activity: Yes  ?  Birth control/protection: Injection  ?Other Topics Concern  ? Not on file  ?Social History Narrative  ? Not on file  ? ?Social Determinants of Health  ? ?Financial Resource Strain: Not on file  ?Food Insecurity: Not on file  ?Transportation Needs: Not on file  ?Physical Activity: Not on file  ?Stress: Not on file  ?Social Connections: Not on file  ?  ? ?Family History:  The patient's family history is not on file.  ? ?ROS:   ?Please see the history of present illness.    ?ROS All other systems reviewed and are negative. ? ?   ? View : No data to display.  ?  ?  ?  ? ? ? ? ? ?PHYSICAL EXAM:   ?VS:  BP 130/74   Pulse 70   Ht '5\' 4"'$  (1.626 m)   Wt 191 lb (86.6 kg)   LMP 01/03/2011    SpO2 98%   BMI 32.79 kg/m?    ?GEN: Well nourished, well developed in no acute distress ?HEENT: Normal ?NECK: No JVD; No carotid bruits ?LYMPHATICS: No lymphadenopathy ?CARDIAC:RRR, no murmurs, rubs, gallops ?RESPIRATORY:  Clear to auscultation without rales, wheezing or rhonchi  ?ABDOMEN: Soft, non-tender, non-distended ?MUSCULOSKELETAL:  No edema; No deformity  ?SKIN: Warm and dry ?NEUROLOGIC:  Alert and oriented x 3 ?PSYCHIATRIC:  Normal affect   ?Wt Readings from Last 3 Encounters:  ?05/05/21 191 lb (86.6 kg)  ?01/19/19 185 lb (83.9 kg)  ?11/05/18 187 lb 3.2 oz (84.9 kg)  ?  ? ? ?Studies/Labs Reviewed:  ? ?EKG:  EKG is  ordered today and demonstrates NSR ? ?Recent Labs: ?No results found for requested labs within last 8760 hours.  ? ?Lipid Panel ?No results found for: CHOL, TRIG, HDL, CHOLHDL, VLDL, LDLCALC, LDLDIRECT ? ?Additional studies/ records that were reviewed today include:  ?Office notes from PCP ? ? ? ?ASSESSMENT:   ? ?1. Palpitations   ?2. Elevated blood pressure reading in office without diagnosis of hypertension   ?3. SOB (shortness of breath)   ?4. Atypical chest pain   ? ? ? ?PLAN:  ?In order of problems listed above: ? ?Palpitations ?-EKG at [facility] ER showed PVCs in 2020  ?-Event monitor was ordered in 2020 but she never followed through due to cost and [facility] is cost prohibitive ?-She was initially on propranolol and this was stopped and she was started on Toprol which then she stopped on her own.   ?-This was restarted at her last office visit in 01/18/2019 but she is not compliant with it and only takes it sporadically ?-I have encouraged her to take her Toprol daily ?-she is willing to try the heart monitor so I will order a 30 day event monitor ? ?2.  Elevated BP ?-BP is controlled today ?-Continue prescription drug management Toprol-XL 25 mg daily with as needed refills ? ?3.  SOB ?-this occurs after walking up multiple steps and may be due to deconditioning and is  stable ?-Felt secondary to sedentary state and possibly anxiety ?-2D echo showed normal LV function ? ?4.  Atypical CP ?-occurs with palpitations ?-likely related to PVCs ?-she has no fm hx of CAD and has never smoked ?-2D echo was normal ?-she has had several episodes where she had to pull off the road while driving ?-I will get a coronary CTA to define coronary anatomy ? ? ? ?Medication Adjustments/Labs and  Tests Ordered: ?Current medicines are reviewed at length with the patient today.  Concerns regarding medicines are outlined above.  Medication changes, Labs and Tests ordered today are listed in the Patient Instructions below. ? ?There are no Patient Instructions on file for this visit. ? ? ? ?Signed, ?Fransico Him, MD  ?05/05/2021 10:17 AM    ?[address] ?[address], [place], [place]  [address] ?Phone: [phone]; Fax: [phone]  ? ?

## 2021-05-05 ENCOUNTER — Encounter: Payer: Self-pay | Admitting: Cardiology

## 2021-05-05 ENCOUNTER — Ambulatory Visit: Payer: BC Managed Care – PPO | Admitting: Cardiology

## 2021-05-05 ENCOUNTER — Ambulatory Visit (INDEPENDENT_AMBULATORY_CARE_PROVIDER_SITE_OTHER): Payer: BC Managed Care – PPO

## 2021-05-05 VITALS — BP 130/74 | HR 70 | Ht 64.0 in | Wt 191.0 lb

## 2021-05-05 DIAGNOSIS — R002 Palpitations: Secondary | ICD-10-CM

## 2021-05-05 DIAGNOSIS — R030 Elevated blood-pressure reading, without diagnosis of hypertension: Secondary | ICD-10-CM

## 2021-05-05 DIAGNOSIS — R072 Precordial pain: Secondary | ICD-10-CM

## 2021-05-05 DIAGNOSIS — R0602 Shortness of breath: Secondary | ICD-10-CM | POA: Diagnosis not present

## 2021-05-05 DIAGNOSIS — R0789 Other chest pain: Secondary | ICD-10-CM | POA: Diagnosis not present

## 2021-05-05 MED ORDER — METOPROLOL SUCCINATE ER 25 MG PO TB24: 25.0000 mg | ORAL_TABLET | Freq: Every day | ORAL | 3 refills | Status: DC

## 2021-05-05 MED ORDER — METOPROLOL TARTRATE 100 MG PO TABS: 100.0000 mg | ORAL_TABLET | Freq: Once | ORAL | 0 refills | Status: DC

## 2021-05-05 NOTE — Addendum Note (Signed)
Addended by: Precious Gilding on: 05/05/2021 10:54 AM ? ? Modules accepted: Orders ? ?

## 2021-05-05 NOTE — Progress Notes (Unsigned)
Enrolled patient for a 14 day Zio XT  monitor to be mailed to patients home  °

## 2021-05-05 NOTE — Addendum Note (Signed)
Addended by: Precious Gilding on: 05/05/2021 10:31 AM ? ? Modules accepted: Orders ? ?

## 2021-05-05 NOTE — Addendum Note (Signed)
Addended by: Precious Gilding on: 05/05/2021 11:12 AM ? ? Modules accepted: Orders ? ?

## 2021-05-05 NOTE — Patient Instructions (Addendum)
Medication Instructions:  ?Your physician recommends that you continue on your current medications as directed. Please refer to the Current Medication list given to you today. ? ?*If you need a refill on your cardiac medications before your next appointment, please call your pharmacy* ? ? ?Lab Work: ?NONE ?If you have labs (blood work) drawn today and your tests are completely normal, you will receive your results only by: ?MyChart Message (if you have MyChart) OR ?A paper copy in the mail ?If you have any lab test that is abnormal or we need to change your treatment, we will call you to review the results. ? ? ?Testing/Procedures: ?Your physician has requested that you have cardiac CT. Cardiac computed tomography (CT) is a painless test that uses an x-ray machine to take clear, detailed pictures of your heart. For further information please visit [email or web address]. Please follow instruction sheet as given. ?  ? ? ?Follow-Up: ?At [facility], you and your health needs are our priority.  As part of our continuing mission to provide you with exceptional heart care, we have created designated Provider Care Teams.  These Care Teams include your primary Cardiologist (physician) and Advanced Practice Providers (APPs -  Physician Assistants and Nurse Practitioners) who all work together to provide you with the care you need, when you need it. ? ?We recommend signing up for the patient portal called "MyChart".  Sign up information is provided on this After Visit Summary.  MyChart is used to connect with patients for Virtual Visits (Telemedicine).  Patients are able to view lab/test results, encounter notes, upcoming appointments, etc.  Non-urgent messages can be sent to your provider as well.   ?To learn more about what you can do with MyChart, go to [email or web address].   ? ?Your next appointment:   ?1 year(s) ? ?The format for your next appointment:   ?In Person ? ?Provider:   ?Fransico Him, MD ? ? ?Other  Instructions ? ? ?Your cardiac CT will be scheduled at the below location:  ? ?[facility] ?[address] ?[place], [place] [address] ?[phone] ? ?At [facility], please arrive at the [facility] and Children's Entrance (Entrance C2) of [facility] 30 minutes prior to test start time. ?You can use the FREE valet parking offered at entrance C (encouraged to control the heart rate for the test)  ?Proceed to the [facility] Radiology Department (first floor) to check-in and test prep. ? ?All radiology patients and guests should use entrance C2 at [facility], accessed from [facility], even though the hospital's physical address listed is [address] ? ? ? ? ?Please follow these instructions carefully (unless otherwise directed): ? ? ?On the Night Before the Test: ?Be sure to Drink plenty of water. ?Do not consume any caffeinated/decaffeinated beverages or chocolate 12 hours prior to your test. ?Do not take any antihistamines 12 hours prior to your test. ? ? ?On the Day of the Test: ?Drink plenty of water until 1 hour prior to the test. ?Do not eat any food 4 hours prior to the test. ?You may take your regular medications prior to the test.  ?Take metoprolol (Lopressor) 100 mg two hours prior to test. ?HOLD Furosemide/Hydrochlorothiazide morning of the test. ?FEMALES- please wear underwire-free bra if available, avoid dresses & tight clothing ? ? ?     ?After the Test: ?Drink plenty of water. ?After receiving IV contrast, you may experience a mild flushed feeling. This is normal. ?On  occasion, you may experience a mild rash up to 24 hours after the test. This is not dangerous. If this occurs, you can take Benadryl 25 mg and increase your fluid intake. ?If you experience trouble breathing, this can be serious. If it is severe call 911 IMMEDIATELY. If it is mild, please call our office. ?If you take any of these medications: Glipizide/Metformin, Avandament,  Glucavance, please do not take 48 hours after completing test unless otherwise instructed. ? ?We will call to schedule your test 2-4 weeks out understanding that some insurance companies will need an authorization prior to the service being performed.  ? ?For non-scheduling related questions, please contact the cardiac imaging nurse navigator should you have any questions/concerns: ?Marchia Bond, Cardiac Imaging Nurse Navigator ?Gordy Clement, Cardiac Imaging Nurse Navigator ?[facility] ?Direct Office Dial: [phone]  ? ? ? ?ZIO XT- Long Term Monitor Instructions ? ?Your physician has requested you wear a ZIO patch monitor for 14 days.  ?This is a single patch monitor. [employer] supplies one patch monitor per enrollment. Additional ?stickers are not available. Please do not apply patch if you will be having a Nuclear Stress Test,  ?Echocardiogram, Cardiac CT, MRI, or Chest Xray during the period you would be wearing the  ?monitor. The patch cannot be worn during these tests. You cannot remove and re-apply the  ?ZIO XT patch monitor.  ?Your ZIO patch monitor will be mailed 3 day USPS to your address on file. It may take 3-5 days  ?to receive your monitor after you have been enrolled.  ?Once you have received your monitor, please review the enclosed instructions. Your monitor  ?has already been registered assigning a specific monitor serial # to you. ? ?Billing and Patient Assistance Program Information ? ?We have supplied [employer] with any of your insurance information on file for billing purposes. ?[employer] offers a sliding scale Patient Assistance Program for patients that do not have  ?insurance, or whose insurance does not completely cover the cost of the ZIO monitor.  ?You must apply for the Patient Assistance Program to qualify for this discounted rate.  ?To apply, please call [employer] at [phone], select option 4, select option 2, ask to apply for  ?Patient Assistance Program.  Theodore Demark will ask your household income, and how many people  ?are in your household. They will quote your out-of-pocket cost based on that information.  ?[employer] will also be able to set up a 57-month interest-free payment plan if needed. ? ?Applying the monitor ?  ?Shave hair from upper left chest.  ?Hold abrader disc by orange tab. Rub abrader in 40 strokes over the upper left chest as  ?indicated in your monitor instructions.  ?Clean area with 4 enclosed alcohol pads. Let dry.  ?Apply patch as indicated in monitor instructions. Patch will be placed under collarbone on left  ?side of chest with arrow pointing upward.  ?Rub patch adhesive wings for 2 minutes. Remove white label marked "1". Remove the white  ?label marked "2". Rub patch adhesive wings for 2 additional minutes.  ?While looking in a mirror, press and release button in center of patch. A small green light will  ?flash 3-4 times. This will be your only indicator that the monitor has been turned on.  ?Do not shower for the first 24 hours. You may shower after the first 24 hours.  ?Press the button if you feel a symptom. You will hear a small click. Record Date, Time and  ?Symptom  in the Patient Logbook.  ?When you are ready to remove the patch, follow instructions on the last 2 pages of Patient  ?Logbook. Stick patch monitor onto the last page of Patient Logbook.  ?Place Patient Logbook in the blue and white box. Use locking tab on box and tape box closed  ?securely. The blue and white box has prepaid postage on it. Please place it in the mailbox as  ?soon as possible. Your physician should have your test results approximately 7 days after the  ?monitor has been mailed back to [facility].  ?Call [facility] at [phone] if you have questions regarding  ?your ZIO XT patch monitor. Call them immediately if you see an orange light blinking on your  ?monitor.  ?If your monitor falls off in less than 4 days, contact our Monitor  department at [phone].  ?If your monitor becomes loose or falls off after 4 days call [employer] at [phone] for  ?suggestions on securing your monitor  ?For scheduling needs, including cancellations and resc

## 2021-05-08 DIAGNOSIS — M47816 Spondylosis without myelopathy or radiculopathy, lumbar region: Secondary | ICD-10-CM | POA: Diagnosis not present

## 2021-05-23 ENCOUNTER — Other Ambulatory Visit: Payer: Self-pay | Admitting: Gastroenterology

## 2021-05-23 ENCOUNTER — Ambulatory Visit
Admission: RE | Admit: 2021-05-23 | Discharge: 2021-05-23 | Disposition: A | Discharge status: Home / Self Care | Payer: BC Managed Care – PPO | Source: Ambulatory Visit | Attending: Gastroenterology | Admitting: Gastroenterology

## 2021-05-23 DIAGNOSIS — D219 Benign neoplasm of connective and other soft tissue, unspecified: Secondary | ICD-10-CM | POA: Diagnosis not present

## 2021-05-23 DIAGNOSIS — K5900 Constipation, unspecified: Secondary | ICD-10-CM

## 2021-05-23 DIAGNOSIS — R1012 Left upper quadrant pain: Secondary | ICD-10-CM | POA: Diagnosis not present

## 2021-05-25 DIAGNOSIS — R002 Palpitations: Secondary | ICD-10-CM

## 2021-05-26 ENCOUNTER — Ambulatory Visit (HOSPITAL_COMMUNITY): Admission: RE | Admit: 2021-05-26 | Payer: BC Managed Care – PPO | Source: Ambulatory Visit

## 2021-05-29 DIAGNOSIS — M47816 Spondylosis without myelopathy or radiculopathy, lumbar region: Secondary | ICD-10-CM | POA: Diagnosis not present

## 2021-06-07 ENCOUNTER — Telehealth (HOSPITAL_COMMUNITY): Payer: Self-pay | Admitting: *Deleted

## 2021-06-07 ENCOUNTER — Encounter (HOSPITAL_COMMUNITY): Payer: Self-pay

## 2021-06-07 NOTE — Telephone Encounter (Signed)
Attempted to call patient regarding upcoming cardiac CT appointment. °Left message on voicemail with name and callback number ° °Joann Jorge RN Navigator Cardiac Imaging °[facility] °[phone] Office °[phone] Cell ° °

## 2021-06-09 ENCOUNTER — Ambulatory Visit (HOSPITAL_COMMUNITY): Admission: RE | Admit: 2021-06-09 | Payer: BC Managed Care – PPO | Source: Ambulatory Visit

## 2021-06-12 DIAGNOSIS — M47817 Spondylosis without myelopathy or radiculopathy, lumbosacral region: Secondary | ICD-10-CM | POA: Diagnosis not present

## 2021-06-28 ENCOUNTER — Telehealth (HOSPITAL_COMMUNITY): Payer: Self-pay | Admitting: *Deleted

## 2021-06-28 NOTE — Telephone Encounter (Signed)
Attempted to call patient regarding upcoming cardiac CT appointment. °Left message on voicemail with name and callback number ° °Kelis Plasse RN Navigator Cardiac Imaging °[facility] °[phone] Office °[phone] Cell ° °

## 2021-06-29 ENCOUNTER — Telehealth (HOSPITAL_COMMUNITY): Payer: Self-pay | Admitting: *Deleted

## 2021-06-29 NOTE — Telephone Encounter (Signed)
Reaching out to patient to offer assistance regarding upcoming cardiac imaging study; pt verbalizes understanding of appt date/time, parking situation and where to check in, pre-test NPO status and medications ordered, and verified current allergies; name and call back number provided for further questions should they arise  Gordy Clement RN Navigator Cardiac Imaging [facility] [phone] office [phone] cell  Patient to hold her daily metoprolol succinate and take '100mg'$  metoprolol tartrate two hours prior to her cardiac CT scan. She is aware to arrive at 8:30am.

## 2021-06-30 ENCOUNTER — Ambulatory Visit (HOSPITAL_COMMUNITY)
Admission: RE | Admit: 2021-06-30 | Discharge: 2021-06-30 | Disposition: A | Discharge status: Home / Self Care | Payer: BC Managed Care – PPO | Source: Ambulatory Visit | Attending: Cardiology | Admitting: Cardiology

## 2021-06-30 DIAGNOSIS — R072 Precordial pain: Secondary | ICD-10-CM | POA: Diagnosis not present

## 2021-06-30 MED ORDER — NITROGLYCERIN 0.4 MG SL SUBL
0.8000 mg | SUBLINGUAL_TABLET | Freq: Once | SUBLINGUAL | Status: AC
  Administered 2021-06-30: 0.8 mg via SUBLINGUAL

## 2021-06-30 MED ORDER — IOHEXOL 350 MG/ML SOLN
100.0000 mL | Freq: Once | INTRAVENOUS | Status: AC | PRN
  Administered 2021-06-30: 100 mL via INTRAVENOUS

## 2021-06-30 MED ORDER — NITROGLYCERIN 0.4 MG SL SUBL
SUBLINGUAL_TABLET | SUBLINGUAL | Status: AC
  Filled 2021-06-30: qty 2

## 2021-07-25 DIAGNOSIS — H40003 Preglaucoma, unspecified, bilateral: Secondary | ICD-10-CM | POA: Diagnosis not present

## 2021-08-28 DIAGNOSIS — K5900 Constipation, unspecified: Secondary | ICD-10-CM | POA: Diagnosis not present

## 2021-09-04 DIAGNOSIS — H401121 Primary open-angle glaucoma, left eye, mild stage: Secondary | ICD-10-CM | POA: Diagnosis not present

## 2021-09-20 DIAGNOSIS — N390 Urinary tract infection, site not specified: Secondary | ICD-10-CM | POA: Diagnosis not present

## 2021-10-12 DIAGNOSIS — M25562 Pain in left knee: Secondary | ICD-10-CM | POA: Diagnosis not present

## 2021-10-20 DIAGNOSIS — M25562 Pain in left knee: Secondary | ICD-10-CM | POA: Diagnosis not present

## 2021-10-24 ENCOUNTER — Other Ambulatory Visit: Payer: BC Managed Care – PPO

## 2021-11-06 DIAGNOSIS — M25562 Pain in left knee: Secondary | ICD-10-CM | POA: Diagnosis not present

## 2021-11-08 ENCOUNTER — Telehealth: Payer: Self-pay | Admitting: Cardiology

## 2021-11-08 NOTE — Telephone Encounter (Signed)
   Name: Michelle Hanson  DOB: August 31, 1965  MRN: [id number]  Primary Cardiologist: Fransico Him, MD   Preoperative team, please contact this patient and set up a phone call appointment for further preoperative risk assessment. Please obtain consent and complete medication review. Thank you for your help.   Murray Hodgkins, NP 11/08/2021, 5:53 PM [place]

## 2021-11-08 NOTE — Telephone Encounter (Signed)
   Pre-operative Risk Assessment    Patient Name: Michelle Hanson  DOB: Jul 12, 1965 MRN: [id number]      Request for Surgical Clearance    Procedure:   Left knee arthroscopy   Date of Surgery:  Clearance 01/08/22                                 Surgeon:  Dr. Dorna Leitz Surgeon's Group or Practice Name:  [facility] Phone number:  [phone] Fax number:  [phone]   Type of Clearance Requested:   - Medical  - Pharmacy:  Hold        Type of Anesthesia:  General    Additional requests/questions:      SignedMilbert Coulter   11/08/2021, 1:04 PM

## 2021-11-13 ENCOUNTER — Telehealth: Payer: Self-pay

## 2021-11-13 NOTE — Telephone Encounter (Signed)
Patient contacted and virtual visit scheduled, consent given and med list reviewed. The patient was agreeable and voiced understanding.

## 2021-11-13 NOTE — Telephone Encounter (Signed)
  Patient Consent for Virtual Visit         ANDILYN BETTCHER has provided verbal consent on 11/13/2021 for a virtual visit (video or telephone).   CONSENT FOR VIRTUAL VISIT FOR:  [name]  By participating in this virtual visit I agree to the following:  I hereby voluntarily request, consent and authorize [facility] and its employed or contracted physicians, physician assistants, nurse practitioners or other licensed health care professionals (the Practitioner), to provide me with telemedicine health care services (the "Services") as deemed necessary by the treating Practitioner. I acknowledge and consent to receive the Services by the Practitioner via telemedicine. I understand that the telemedicine visit will involve communicating with the Practitioner through live audiovisual communication technology and the disclosure of certain medical information by electronic transmission. I acknowledge that I have been given the opportunity to request an in-person assessment or other available alternative prior to the telemedicine visit and am voluntarily participating in the telemedicine visit.  I understand that I have the right to withhold or withdraw my consent to the use of telemedicine in the course of my care at any time, without affecting my right to future care or treatment, and that the Practitioner or I may terminate the telemedicine visit at any time. I understand that I have the right to inspect all information obtained and/or recorded in the course of the telemedicine visit and may receive copies of available information for a reasonable fee.  I understand that some of the potential risks of receiving the Services via telemedicine include:  Delay or interruption in medical evaluation due to technological equipment failure or disruption; Information transmitted may not be sufficient (e.g. poor resolution of images) to allow for appropriate medical decision making by the Practitioner;  and/or  In rare instances, security protocols could fail, causing a breach of personal health information.  Furthermore, I acknowledge that it is my responsibility to provide information about my medical history, conditions and care that is complete and accurate to the best of my ability. I acknowledge that Practitioner's advice, recommendations, and/or decision may be based on factors not within their control, such as incomplete or inaccurate data provided by me or distortions of diagnostic images or specimens that may result from electronic transmissions. I understand that the practice of medicine is not an exact science and that Practitioner makes no warranties or guarantees regarding treatment outcomes. I acknowledge that a copy of this consent can be made available to me via my patient portal ([facility]), or I can request a printed copy by calling the office of [place].    I understand that my insurance will be billed for this visit.   I have read or had this consent read to me. I understand the contents of this consent, which adequately explains the benefits and risks of the Services being provided via telemedicine.  I have been provided ample opportunity to ask questions regarding this consent and the Services and have had my questions answered to my satisfaction. I give my informed consent for the services to be provided through the use of telemedicine in my medical care

## 2021-11-23 ENCOUNTER — Other Ambulatory Visit: Payer: Self-pay

## 2021-11-23 MED ORDER — METOPROLOL SUCCINATE ER 25 MG PO TB24: 25.0000 mg | ORAL_TABLET | Freq: Every day | ORAL | 1 refills | Status: DC

## 2021-12-06 NOTE — Progress Notes (Unsigned)
Virtual Visit via Telephone Note   Because of [place] A Derk's co-morbid illnesses, she is at least at moderate risk for complications without adequate follow up.  This format is felt to be most appropriate for this patient at this time.  The patient did not have access to video technology/had technical difficulties with video requiring transitioning to audio format only (telephone).  All issues noted in this document were discussed and addressed.  No physical exam could be performed with this format.  Please refer to the patient's chart for her consent to telehealth for [facility].  Evaluation Performed:  Preoperative cardiovascular risk assessment _____________   Date:  12/06/2021   Patient ID:  Michelle Hanson, DOB [birth year], MRN [id number] Patient Location:  Home Provider location:   Office  Primary Care Provider:  Shirline Frees, MD Primary Cardiologist:  Fransico Him, MD  Chief Complaint / Patient Profile   56 y.o. y/o female with a h/o palpitations, PVCs, mild MR, coronary calcium score of 0 who is pending left knee arthroscopy and presents today for telephonic preoperative cardiovascular risk assessment.  Past Medical History    Past Medical History:  Diagnosis Date   Anxiety    Past Surgical History:  Procedure Laterality Date   IR RADIOLOGIST EVAL & MGMT  02/10/2021   TUBAL LIGATION      Allergies  Allergies  Allergen Reactions   Latex Rash    History of Present Illness    Michelle Hanson is a 56 y.o. female who presents via audio/video conferencing for a telehealth visit today.  Pt was last seen in cardiology clinic on 05/05/2021 by Dr. Radford Pax.  At that time Michelle Hanson was doing well.  The patient is now pending procedure as outlined above. Since her last visit, she denies chest pain, shortness of breath, lower extremity edema, fatigue, palpitations, melena, hematuria, hemoptysis, diaphoresis, weakness, presyncope, syncope, orthopnea, and PND. She walks  daily for exercise and does heavy lifting at work without any concerning cardiac symptoms.   Home Medications    Prior to Admission medications   Medication Sig Start Date End Date Taking? Authorizing Provider  ibuprofen (ADVIL,MOTRIN) 800 MG tablet Take 1 tablet (800 mg total) by mouth 3 (three) times daily. 05/16/12   Charlann Lange, PA-C  LINZESS 72 MCG capsule Take 72 mcg by mouth every morning. 12/20/20   [provider]  LORazepam (ATIVAN) 1 MG tablet Take 1 mg by mouth daily as needed. 06/27/21   [provider]  metoprolol succinate (TOPROL-XL) 25 MG 24 hr tablet Take 1 tablet (25 mg total) by mouth daily. 11/23/21   Sueanne Margarita, MD  Multiple Vitamin (MULTIVITAMIN) tablet Take 1 tablet by mouth daily.    [provider]  nystatin-triamcinolone ointment (MYCOLOG) Apply topically as needed. 03/28/21   [provider]  pantoprazole (PROTONIX) 40 MG tablet Take 40 mg by mouth daily at 6 (six) AM. 12/23/20   [provider]    Physical Exam    Vital Signs:  Michelle Hanson does not have vital signs available for review today.  Given telephonic nature of communication, physical exam is limited. AAOx3. NAD. Normal affect.  Speech and respirations are unlabored.  Accessory Clinical Findings    None  Assessment & Plan    1.  Preoperative Cardiovascular Risk Assessment: The patient is doing well from a cardiac perspective. Therefore, based on ACC/AHA guidelines, the patient would be at acceptable risk for the planned procedure without further  cardiovascular testing.  According to the Revised Cardiac Risk Index (RCRI), her Perioperative Risk of Major Cardiac Event is (%): 0.9. Her Functional Capacity in METs is: 7.04 according to the [employer] Activity Status Index (DASI).  The patient was advised that if she develops new symptoms prior to surgery to contact our office to arrange for a follow-up visit, and she verbalized understanding.  No cardiac  medications to hold. A copy of this note will be routed to requesting surgeon.  Time:   Today, I have spent 6 minutes with the patient with telehealth technology discussing medical history, symptoms, and management plan.     Emmaline Life, NP-C  12/07/2021, 10:13 AM [address], [address] Office [phone] Fax [phone]

## 2021-12-07 ENCOUNTER — Ambulatory Visit: Payer: BC Managed Care – PPO | Attending: Internal Medicine | Admitting: Nurse Practitioner

## 2021-12-07 ENCOUNTER — Encounter: Payer: Self-pay | Admitting: Nurse Practitioner

## 2021-12-07 DIAGNOSIS — Z01810 Encounter for preprocedural cardiovascular examination: Secondary | ICD-10-CM | POA: Diagnosis not present

## 2021-12-08 ENCOUNTER — Encounter: Payer: Self-pay | Admitting: Obstetrics and Gynecology

## 2021-12-13 ENCOUNTER — Other Ambulatory Visit: Payer: Self-pay | Admitting: Obstetrics and Gynecology

## 2021-12-13 ENCOUNTER — Ambulatory Visit
Admission: RE | Admit: 2021-12-13 | Discharge: 2021-12-13 | Disposition: A | Discharge status: Home / Self Care | Payer: BC Managed Care – PPO | Source: Ambulatory Visit | Attending: Obstetrics and Gynecology | Admitting: Obstetrics and Gynecology

## 2021-12-13 DIAGNOSIS — N6310 Unspecified lump in the right breast, unspecified quadrant: Secondary | ICD-10-CM | POA: Diagnosis not present

## 2021-12-13 DIAGNOSIS — R928 Other abnormal and inconclusive findings on diagnostic imaging of breast: Secondary | ICD-10-CM

## 2021-12-13 DIAGNOSIS — N6320 Unspecified lump in the left breast, unspecified quadrant: Secondary | ICD-10-CM | POA: Diagnosis not present

## 2021-12-13 DIAGNOSIS — Z803 Family history of malignant neoplasm of breast: Secondary | ICD-10-CM

## 2021-12-13 MED ORDER — GADOPICLENOL 0.5 MMOL/ML IV SOLN
9.0000 mL | Freq: Once | INTRAVENOUS | Status: AC | PRN
  Administered 2021-12-13: 9 mL via INTRAVENOUS

## 2021-12-25 ENCOUNTER — Ambulatory Visit
Admission: RE | Admit: 2021-12-25 | Discharge: 2021-12-25 | Disposition: A | Discharge status: Home / Self Care | Payer: BC Managed Care – PPO | Source: Ambulatory Visit | Attending: Obstetrics and Gynecology | Admitting: Obstetrics and Gynecology

## 2021-12-25 DIAGNOSIS — R92323 Mammographic fibroglandular density, bilateral breasts: Secondary | ICD-10-CM | POA: Diagnosis not present

## 2021-12-25 DIAGNOSIS — R928 Other abnormal and inconclusive findings on diagnostic imaging of breast: Secondary | ICD-10-CM

## 2021-12-25 DIAGNOSIS — Z803 Family history of malignant neoplasm of breast: Secondary | ICD-10-CM | POA: Diagnosis not present

## 2021-12-27 ENCOUNTER — Ambulatory Visit
Admission: RE | Admit: 2021-12-27 | Discharge: 2021-12-27 | Disposition: A | Discharge status: Home / Self Care | Payer: BC Managed Care – PPO | Source: Ambulatory Visit | Attending: Obstetrics and Gynecology | Admitting: Obstetrics and Gynecology

## 2021-12-27 DIAGNOSIS — N6001 Solitary cyst of right breast: Secondary | ICD-10-CM | POA: Diagnosis not present

## 2021-12-27 DIAGNOSIS — N6341 Unspecified lump in right breast, subareolar: Secondary | ICD-10-CM | POA: Diagnosis not present

## 2021-12-27 DIAGNOSIS — R928 Other abnormal and inconclusive findings on diagnostic imaging of breast: Secondary | ICD-10-CM

## 2021-12-29 DIAGNOSIS — Z131 Encounter for screening for diabetes mellitus: Secondary | ICD-10-CM | POA: Diagnosis not present

## 2021-12-29 DIAGNOSIS — Z13220 Encounter for screening for lipoid disorders: Secondary | ICD-10-CM | POA: Diagnosis not present

## 2021-12-29 DIAGNOSIS — Z1329 Encounter for screening for other suspected endocrine disorder: Secondary | ICD-10-CM | POA: Diagnosis not present

## 2021-12-29 DIAGNOSIS — Z0000 Encounter for general adult medical examination without abnormal findings: Secondary | ICD-10-CM | POA: Diagnosis not present

## 2021-12-29 DIAGNOSIS — Z6834 Body mass index (BMI) 34.0-34.9, adult: Secondary | ICD-10-CM | POA: Diagnosis not present

## 2021-12-29 DIAGNOSIS — Z01419 Encounter for gynecological examination (general) (routine) without abnormal findings: Secondary | ICD-10-CM | POA: Diagnosis not present

## 2022-01-04 ENCOUNTER — Other Ambulatory Visit: Payer: Self-pay | Admitting: Obstetrics and Gynecology

## 2022-01-04 ENCOUNTER — Ambulatory Visit
Admission: RE | Admit: 2022-01-04 | Discharge: 2022-01-04 | Disposition: A | Discharge status: Home / Self Care | Payer: BC Managed Care – PPO | Source: Ambulatory Visit | Attending: Obstetrics and Gynecology | Admitting: Obstetrics and Gynecology

## 2022-01-04 DIAGNOSIS — R9389 Abnormal findings on diagnostic imaging of other specified body structures: Secondary | ICD-10-CM

## 2022-01-04 DIAGNOSIS — N6012 Diffuse cystic mastopathy of left breast: Secondary | ICD-10-CM | POA: Diagnosis not present

## 2022-01-04 DIAGNOSIS — N6320 Unspecified lump in the left breast, unspecified quadrant: Secondary | ICD-10-CM

## 2022-01-04 DIAGNOSIS — N6324 Unspecified lump in the left breast, lower inner quadrant: Secondary | ICD-10-CM | POA: Diagnosis not present

## 2022-01-04 MED ORDER — GADOPICLENOL 0.5 MMOL/ML IV SOLN
9.0000 mL | Freq: Once | INTRAVENOUS | Status: AC | PRN
  Administered 2022-01-04: 9 mL via INTRAVENOUS

## 2022-01-08 DIAGNOSIS — M23002 Cystic meniscus, unspecified lateral meniscus, unspecified knee: Secondary | ICD-10-CM | POA: Diagnosis not present

## 2022-01-08 DIAGNOSIS — M6752 Plica syndrome, left knee: Secondary | ICD-10-CM | POA: Diagnosis not present

## 2022-01-08 DIAGNOSIS — M94262 Chondromalacia, left knee: Secondary | ICD-10-CM | POA: Diagnosis not present

## 2022-01-08 DIAGNOSIS — Y999 Unspecified external cause status: Secondary | ICD-10-CM | POA: Diagnosis not present

## 2022-01-08 DIAGNOSIS — M23062 Cystic meniscus, other lateral meniscus, left knee: Secondary | ICD-10-CM | POA: Diagnosis not present

## 2022-01-08 DIAGNOSIS — G8918 Other acute postprocedural pain: Secondary | ICD-10-CM | POA: Diagnosis not present

## 2022-01-08 DIAGNOSIS — S83272A Complex tear of lateral meniscus, current injury, left knee, initial encounter: Secondary | ICD-10-CM | POA: Diagnosis not present

## 2022-01-08 DIAGNOSIS — X58XXXA Exposure to other specified factors, initial encounter: Secondary | ICD-10-CM | POA: Diagnosis not present

## 2022-01-17 ENCOUNTER — Other Ambulatory Visit (HOSPITAL_COMMUNITY): Payer: Self-pay | Admitting: Diagnostic Radiology

## 2022-01-17 ENCOUNTER — Ambulatory Visit
Admission: RE | Admit: 2022-01-17 | Discharge: 2022-01-17 | Disposition: A | Discharge status: Home / Self Care | Payer: BC Managed Care – PPO | Source: Ambulatory Visit | Attending: Obstetrics and Gynecology | Admitting: Obstetrics and Gynecology

## 2022-01-17 DIAGNOSIS — R9389 Abnormal findings on diagnostic imaging of other specified body structures: Secondary | ICD-10-CM

## 2022-01-17 DIAGNOSIS — R928 Other abnormal and inconclusive findings on diagnostic imaging of breast: Secondary | ICD-10-CM | POA: Diagnosis not present

## 2022-01-17 DIAGNOSIS — N6012 Diffuse cystic mastopathy of left breast: Secondary | ICD-10-CM | POA: Diagnosis not present

## 2022-01-18 DIAGNOSIS — M6281 Muscle weakness (generalized): Secondary | ICD-10-CM | POA: Diagnosis not present

## 2022-01-18 DIAGNOSIS — S83204D Other tear of unspecified meniscus, current injury, left knee, subsequent encounter: Secondary | ICD-10-CM | POA: Diagnosis not present

## 2022-01-18 DIAGNOSIS — M25662 Stiffness of left knee, not elsewhere classified: Secondary | ICD-10-CM | POA: Diagnosis not present

## 2022-01-18 DIAGNOSIS — M1712 Unilateral primary osteoarthritis, left knee: Secondary | ICD-10-CM | POA: Diagnosis not present

## 2022-01-25 DIAGNOSIS — M25662 Stiffness of left knee, not elsewhere classified: Secondary | ICD-10-CM | POA: Diagnosis not present

## 2022-01-25 DIAGNOSIS — S83204D Other tear of unspecified meniscus, current injury, left knee, subsequent encounter: Secondary | ICD-10-CM | POA: Diagnosis not present

## 2022-01-25 DIAGNOSIS — M6281 Muscle weakness (generalized): Secondary | ICD-10-CM | POA: Diagnosis not present

## 2022-01-30 DIAGNOSIS — M25662 Stiffness of left knee, not elsewhere classified: Secondary | ICD-10-CM | POA: Diagnosis not present

## 2022-01-30 DIAGNOSIS — M6281 Muscle weakness (generalized): Secondary | ICD-10-CM | POA: Diagnosis not present

## 2022-01-30 DIAGNOSIS — S83204D Other tear of unspecified meniscus, current injury, left knee, subsequent encounter: Secondary | ICD-10-CM | POA: Diagnosis not present

## 2022-02-01 DIAGNOSIS — M25662 Stiffness of left knee, not elsewhere classified: Secondary | ICD-10-CM | POA: Diagnosis not present

## 2022-02-01 DIAGNOSIS — M6281 Muscle weakness (generalized): Secondary | ICD-10-CM | POA: Diagnosis not present

## 2022-02-01 DIAGNOSIS — S83204D Other tear of unspecified meniscus, current injury, left knee, subsequent encounter: Secondary | ICD-10-CM | POA: Diagnosis not present

## 2022-02-06 DIAGNOSIS — M6281 Muscle weakness (generalized): Secondary | ICD-10-CM | POA: Diagnosis not present

## 2022-02-06 DIAGNOSIS — M25662 Stiffness of left knee, not elsewhere classified: Secondary | ICD-10-CM | POA: Diagnosis not present

## 2022-02-06 DIAGNOSIS — S83204D Other tear of unspecified meniscus, current injury, left knee, subsequent encounter: Secondary | ICD-10-CM | POA: Diagnosis not present

## 2022-02-07 DIAGNOSIS — M25662 Stiffness of left knee, not elsewhere classified: Secondary | ICD-10-CM | POA: Diagnosis not present

## 2022-02-07 DIAGNOSIS — S83204D Other tear of unspecified meniscus, current injury, left knee, subsequent encounter: Secondary | ICD-10-CM | POA: Diagnosis not present

## 2022-02-07 DIAGNOSIS — M6281 Muscle weakness (generalized): Secondary | ICD-10-CM | POA: Diagnosis not present

## 2022-02-12 DIAGNOSIS — M25462 Effusion, left knee: Secondary | ICD-10-CM | POA: Diagnosis not present

## 2022-02-13 DIAGNOSIS — M25662 Stiffness of left knee, not elsewhere classified: Secondary | ICD-10-CM | POA: Diagnosis not present

## 2022-02-13 DIAGNOSIS — M6281 Muscle weakness (generalized): Secondary | ICD-10-CM | POA: Diagnosis not present

## 2022-02-13 DIAGNOSIS — S83204D Other tear of unspecified meniscus, current injury, left knee, subsequent encounter: Secondary | ICD-10-CM | POA: Diagnosis not present

## 2022-02-15 DIAGNOSIS — M25662 Stiffness of left knee, not elsewhere classified: Secondary | ICD-10-CM | POA: Diagnosis not present

## 2022-02-15 DIAGNOSIS — S83204D Other tear of unspecified meniscus, current injury, left knee, subsequent encounter: Secondary | ICD-10-CM | POA: Diagnosis not present

## 2022-02-15 DIAGNOSIS — M6281 Muscle weakness (generalized): Secondary | ICD-10-CM | POA: Diagnosis not present

## 2022-02-19 DIAGNOSIS — M6281 Muscle weakness (generalized): Secondary | ICD-10-CM | POA: Diagnosis not present

## 2022-02-19 DIAGNOSIS — S83204D Other tear of unspecified meniscus, current injury, left knee, subsequent encounter: Secondary | ICD-10-CM | POA: Diagnosis not present

## 2022-02-19 DIAGNOSIS — M25662 Stiffness of left knee, not elsewhere classified: Secondary | ICD-10-CM | POA: Diagnosis not present

## 2022-02-27 DIAGNOSIS — M6281 Muscle weakness (generalized): Secondary | ICD-10-CM | POA: Diagnosis not present

## 2022-02-27 DIAGNOSIS — M25662 Stiffness of left knee, not elsewhere classified: Secondary | ICD-10-CM | POA: Diagnosis not present

## 2022-02-27 DIAGNOSIS — S83204D Other tear of unspecified meniscus, current injury, left knee, subsequent encounter: Secondary | ICD-10-CM | POA: Diagnosis not present

## 2022-03-12 ENCOUNTER — Encounter: Payer: Self-pay | Admitting: Genetic Counselor

## 2022-03-13 ENCOUNTER — Other Ambulatory Visit: Payer: Self-pay

## 2022-03-13 ENCOUNTER — Encounter: Payer: Self-pay | Admitting: Genetic Counselor

## 2022-03-13 ENCOUNTER — Inpatient Hospital Stay: Payer: BC Managed Care – PPO | Attending: Genetic Counselor | Admitting: Genetic Counselor

## 2022-03-13 ENCOUNTER — Inpatient Hospital Stay: Payer: BC Managed Care – PPO

## 2022-03-13 DIAGNOSIS — Z1379 Encounter for other screening for genetic and chromosomal anomalies: Secondary | ICD-10-CM

## 2022-03-13 DIAGNOSIS — Z803 Family history of malignant neoplasm of breast: Secondary | ICD-10-CM

## 2022-03-13 DIAGNOSIS — Z9189 Other specified personal risk factors, not elsewhere classified: Secondary | ICD-10-CM | POA: Insufficient documentation

## 2022-03-13 DIAGNOSIS — Z800 Family history of malignant neoplasm of digestive organs: Secondary | ICD-10-CM | POA: Diagnosis not present

## 2022-03-13 NOTE — Progress Notes (Signed)
REFERRING PROVIDER: Shirline Frees, MD [address] [place],  [place] [id number]  PRIMARY PROVIDER:  Shirline Frees, MD  PRIMARY REASON FOR VISIT:  Encounter Diagnoses  Name Primary?   Genetic testing    Family history of breast cancer Yes   Family history of colon cancer    HISTORY OF PRESENT ILLNESS:   Michelle Hanson, a 57 y.o. female, was seen for a [facility] cancer genetics consultation at the request of Dr. Kenton Kingfisher due to a family history of cancer.  Michelle Hanson presents to clinic today to discuss the possibility of a hereditary predisposition to cancer, to discuss genetic testing, and to further clarify Michelle future cancer risks, as well as potential cancer risks for family members.   Michelle Hanson is a 57 y.o. female with no personal history of cancer.    RISK FACTORS:  Menarche was at age 34.  First live birth at age 21.  OCP use for approximately 1 year.  Ovaries intact: yes.  Uterus intact: yes.  Menopausal status: menopause at age 60.  HRT use: 0 years. Colonoscopy: yes; normal. Mammogram within the last year: yes. Number of breast biopsies: 2, Michelle biopsy in 12/2021 showed fibrocystic change with usual ductal hyperplasia and a small complex sclerosing lesion. Up to date with pelvic exams: yes. Any excessive radiation exposure in the past: no  Past Medical History:  Diagnosis Date   Anxiety     Past Surgical History:  Procedure Laterality Date   IR RADIOLOGIST EVAL & MGMT  02/10/2021   TUBAL LIGATION      Social History   Socioeconomic History   Marital status: Divorced    Spouse name: Not on file   Number of children: Not on file   Years of education: Not on file   Highest education level: Not on file  Occupational History   Not on file  Tobacco Use   Smoking status: Never   Smokeless tobacco: Never  Substance and Sexual Activity   Alcohol use: No   Drug use: No   Sexual activity: Yes    Birth control/protection: Injection  Other Topics  Concern   Not on file  Social History Narrative   Not on file   Social Determinants of Health   Financial Resource Strain: Not on file  Food Insecurity: Not on file  Transportation Needs: Not on file  Physical Activity: Not on file  Stress: Not on file  Social Connections: Not on file     FAMILY HISTORY:  We obtained a detailed, 4-generation family history.  Significant diagnoses are listed below: Family History  Problem Relation Age of Onset   Diabetes Mother    Hypertension Mother    Congestive Heart Failure Mother    Breast cancer Mother 65 - 86   Diabetes Father    Hypertension Father    Breast cancer Maternal Aunt    Breast cancer Maternal Aunt    Breast cancer Maternal Aunt    Colon cancer Paternal Uncle        dx. >50   Hypertension Maternal Grandfather    Diabetes Paternal Grandmother    Hypertension Paternal Grandmother    Hypertension Paternal Grandfather    Breast cancer Half-Hanson 27       maternal half Hanson   Breast cancer Hanson 57   Prostate cancer Half-Brother 30       maternal half brother   Colon cancer Half-Brother 36      Michelle Hanson's maternal half Hanson  was diagnosed with breast cancer at age 26. Michelle Hanson's daughter was diagnosed with breast cancer at age 73. Michelle Hanson maternal half brother was diagnosed with prostate and colon cancer at age 12. Michelle mother was diagnosed with breast cancer in Michelle 66s, she died at age 61. Michelle Hanson had five maternal aunts and three had a history of breast cancer, all are deceased. Michelle paternal uncle was diagnosed with colon cancer at an unknown age (>50) and died due to metastatic colon cancer. Michelle Hanson is unaware of previous family history of genetic testing for hereditary cancer risks.  GENETIC COUNSELING ASSESSMENT: Michelle Hanson is a 57 y.o. female with a family history of cancer. She already had comprehensive genetic testing in 2020. Therefore, we reviewed Michelle results and recommendations during today's  session.   GENETIC TEST RESULTS:  The [facility] Multi-Cancer Panel found no pathogenic mutations.   The Multi-Cancer Panel offered by [facility] includes sequencing and/or deletion/duplication analysis of the following 84 genes:  AIP, ALK, APC, ATM, AXIN2, BAP1, BARD1, BLM, BMPR1A, BRCA1, BRCA2, BRIP1, CASR, CDC73, CDH1, CDK4, CDKN1B, CDKN1C, CDKN2A, CEBPA, CHEK2, CTNNA1, DICER1, DIS3L2, EGFR, EPCAM, FH, FLCN, GATA2, GPC3, GREM1, HOXB13, HRAS, KIT, MAX, MEN1, MET, MITF, MLH1, MSH2, MSH3, MSH6, MUTYH, NBN, NF1, NF2, NTHL1, PALB2, PDGFRA, PHOX2B, PMS2, POLD1, POLE, POT1, PRKAR1A, PTCH1, PTEN, RAD50, RAD51C, RAD51D, RB1, RECQL4, RET, RUNX1, SDHA, SDHAF2, SDHB, SDHC, SDHD, SMAD4, SMARCA4, SMARCB1, SMARCE1, STK11, SUFU, TERC, TERT, TMEM127, TP53, TSC1, TSC2, VHL, WRN, and WT1.     The test report has been scanned into EPIC and is located under the Molecular Pathology section of the Results Review tab.  A portion of the result report is included below for reference. Genetic testing reported out on 12/22/2018.       Genetic testing identified a variant of uncertain significance (VUS) in the POLD1 gene called c.713C>T (p.Thr238Met).  At this time, it is unknown if this variant is associated with an increased risk for cancer or if it is benign, but most uncertain variants are reclassified to benign. It should not be used to make medical management decisions. With time, we suspect the laboratory will determine the significance of this variant, if any. If the laboratory reclassifies this variant, we will attempt to contact Michelle Hanson to discuss it further.   Even though a pathogenic variant was not identified, possible explanations for the cancer in the family may include: There may be no hereditary risk for cancer in the family. The cancers in Michelle family may be due to other genetic or environmental factors. There may be a gene mutation in one of these genes that current testing methods cannot detect, but that  chance is small. There could be another gene that has not yet been discovered, or that we have not yet tested, that is responsible for the cancer diagnoses in the family.  It is also possible there is a hereditary cause for the cancer in the family that Michelle Hanson.  Therefore, it is important to remain in touch with cancer genetics in the future so that we can continue to offer Michelle Hanson the most up to date genetic testing.   ADDITIONAL GENETIC TESTING:  We discussed with Michelle Hanson that Michelle genetic testing was fairly extensive.  If there are genes identified to increase cancer risk that can be analyzed in the future, we would be happy to discuss and coordinate this testing at that time.    CANCER SCREENING RECOMMENDATIONS:  Michelle Hanson test  result is considered negative (normal).  This means that we have not identified a hereditary cause for Michelle family history of cancer at this time. An individual's cancer risk and medical management are not determined by genetic test results alone. Overall cancer risk assessment incorporates additional factors, including personal medical history, family history, and any available genetic information that may result in a personalized plan for cancer prevention and surveillance. Therefore, it is recommended she continue to follow the cancer management and screening guidelines provided by Michelle healthcare providers.  Based on the reported personal and family history, specific cancer screenings for Michelle Hanson and Michelle family include:  Breast Cancer Screening:  The Tyrer-Cuzick model is one of multiple prediction models developed to estimate an individual's lifetime risk of developing breast cancer. The Tyrer-Cuzick model is endorsed by the [employer]  (NCCN). This model includes many risk factors such as family history, endogenous estrogen exposure, and benign breast disease. The calculation is highly-dependent on the accuracy  of clinical data provided by the patient and can change over time. The Tyrer-Cuzick model may be repeated to reflect new information in Michelle personal or family history in the future.   Michelle Hanson Tyrer-Cuzick risk score is 31%.  For women with a greater than 20% lifetime risk of breast cancer, the NCCN recommends the following:    1.   Clinical encounter every 6-12 months to begin when identified as being at increased risk, but not before age 62    2.   Annual mammograms, tomosynthesis is recommended starting 10 years earlier than the youngest breast cancer diagnosis in the family or at age 19 (whichever comes first), but not before age 66     3.   Annual breast MRI starting 10 years earlier than the youngest breast cancer diagnosis in the family or at age 75 (whichever comes first), but not before age 14  [place]:   Since she did not Hanson a mutation in a cancer predisposition gene included on this panel, Michelle children could not have inherited a mutation from Michelle in one of these genes. Individuals in this family might be at some increased risk of developing cancer, over the general population risk, due to the family history of cancer. We recommend women in this family have a yearly mammogram beginning at age 7, or 53 years younger than the earliest onset of cancer, an annual clinical breast exam, and perform monthly breast self-exams.  Other members of the family may still carry a pathogenic variant in one of these genes that Michelle Hanson did not Hanson. Based on the family history, we recommend Michelle Hanson and Hanson who have a history of breast cancer consider genetic counseling and testing.  We do not recommend familial testing for the POLD1 variant of uncertain significance (VUS).  FOLLOW-UP:  Cancer genetics is a rapidly advancing field and it is possible that new genetic tests will be appropriate for Michelle and/or Michelle family members in the future. We encouraged Michelle to  remain in contact with cancer genetics on an annual basis so we can update Michelle personal and family histories and let Michelle know of advances in cancer genetics that may benefit this family.   Ms. Mendelsohn questions were answered to Michelle satisfaction today. Our contact information was provided should additional questions or concerns arise. Thank you for the referral and allowing [place] to share in the care of your patient.   Lucille Passy, MS, [facility] Genetic Counselor [email or web address] (P)  [phone]  The patient was seen for a total of 30 minutes in face-to-face genetic counseling.  The patient was seen alone.  Drs. Lindi Adie and/or Burr Medico were available to discuss this case as needed.  _______________________________________________________________________ For Office Staff:  Number of people involved in session: 1 Was an Intern/ student involved with case: no

## 2022-04-24 DIAGNOSIS — M25562 Pain in left knee: Secondary | ICD-10-CM | POA: Diagnosis not present

## 2022-11-07 DIAGNOSIS — K5900 Constipation, unspecified: Secondary | ICD-10-CM | POA: Diagnosis not present

## 2022-11-07 DIAGNOSIS — R1084 Generalized abdominal pain: Secondary | ICD-10-CM | POA: Diagnosis not present

## 2022-12-08 IMAGING — CR DG RIBS W/ CHEST 3+V*L*
3 series · 3 of 3 positions shown · non-contrast
Comparison: None.

CLINICAL DATA: Left chest pain for 2 weeks.  No known injury.

EXAM:
LEFT RIBS AND CHEST - 3+ VIEW

[w chest pa]
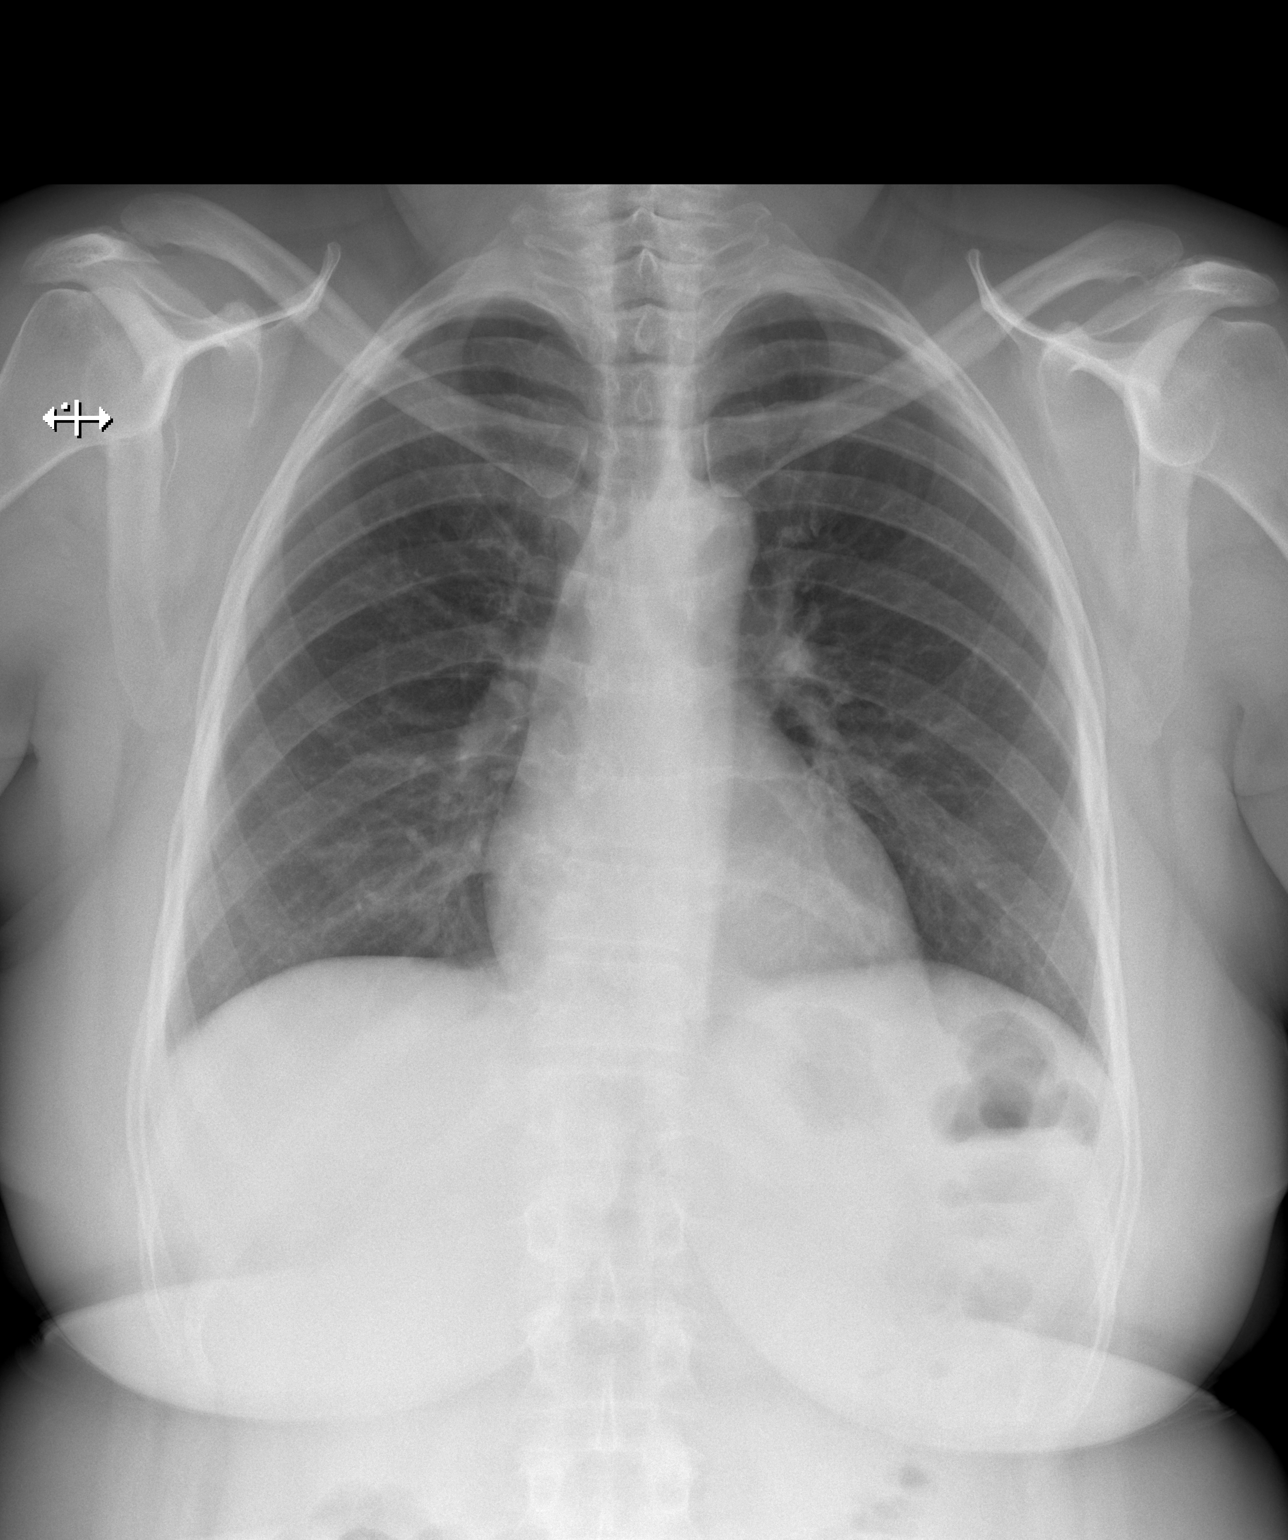

[w ribs ap upper left]
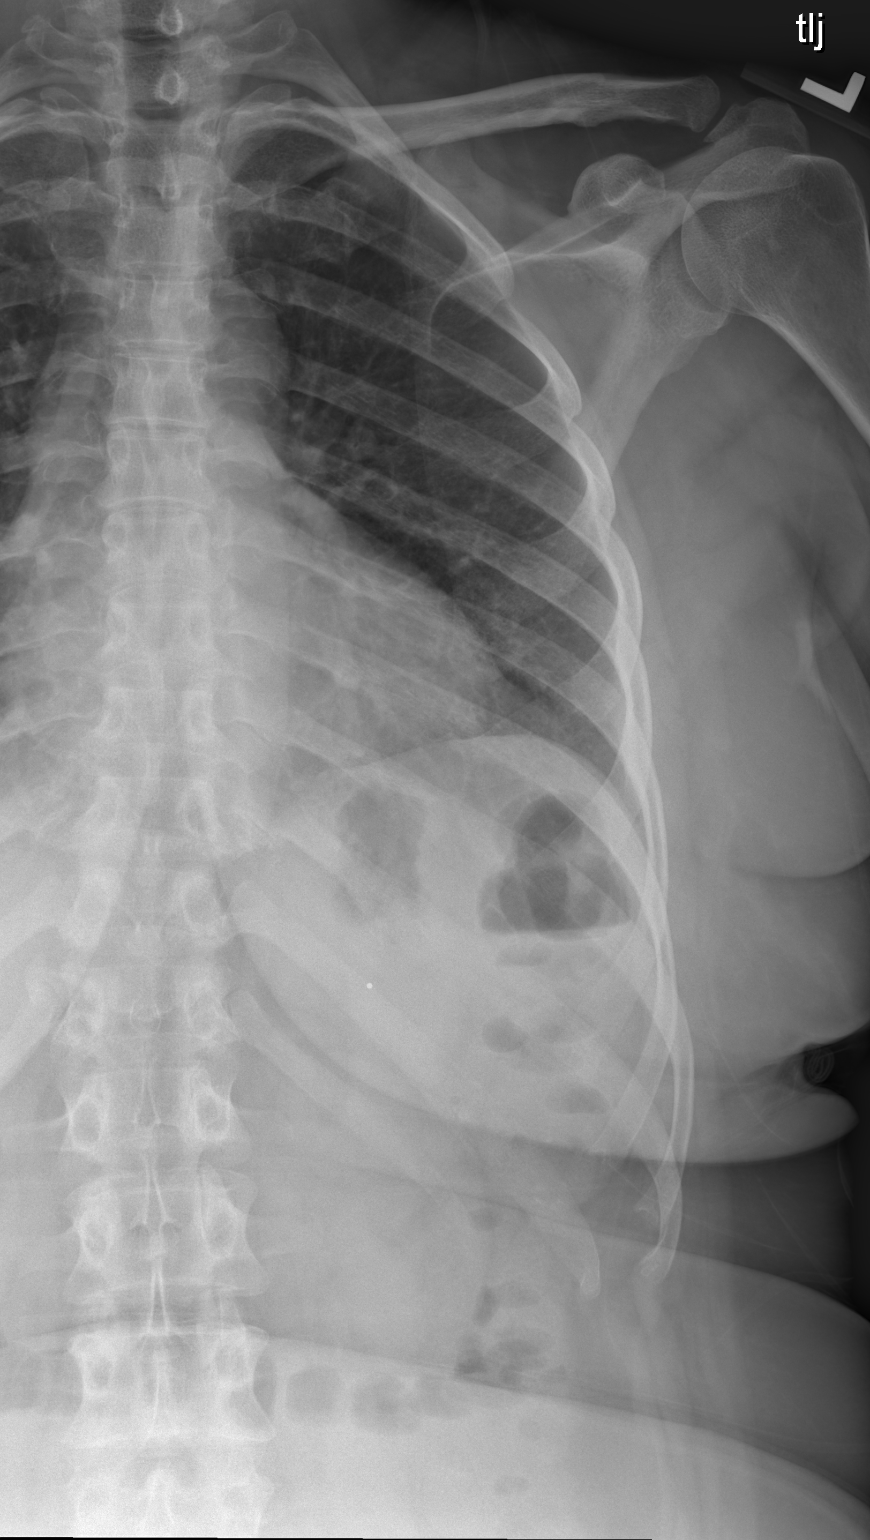

[w ribs obl left]
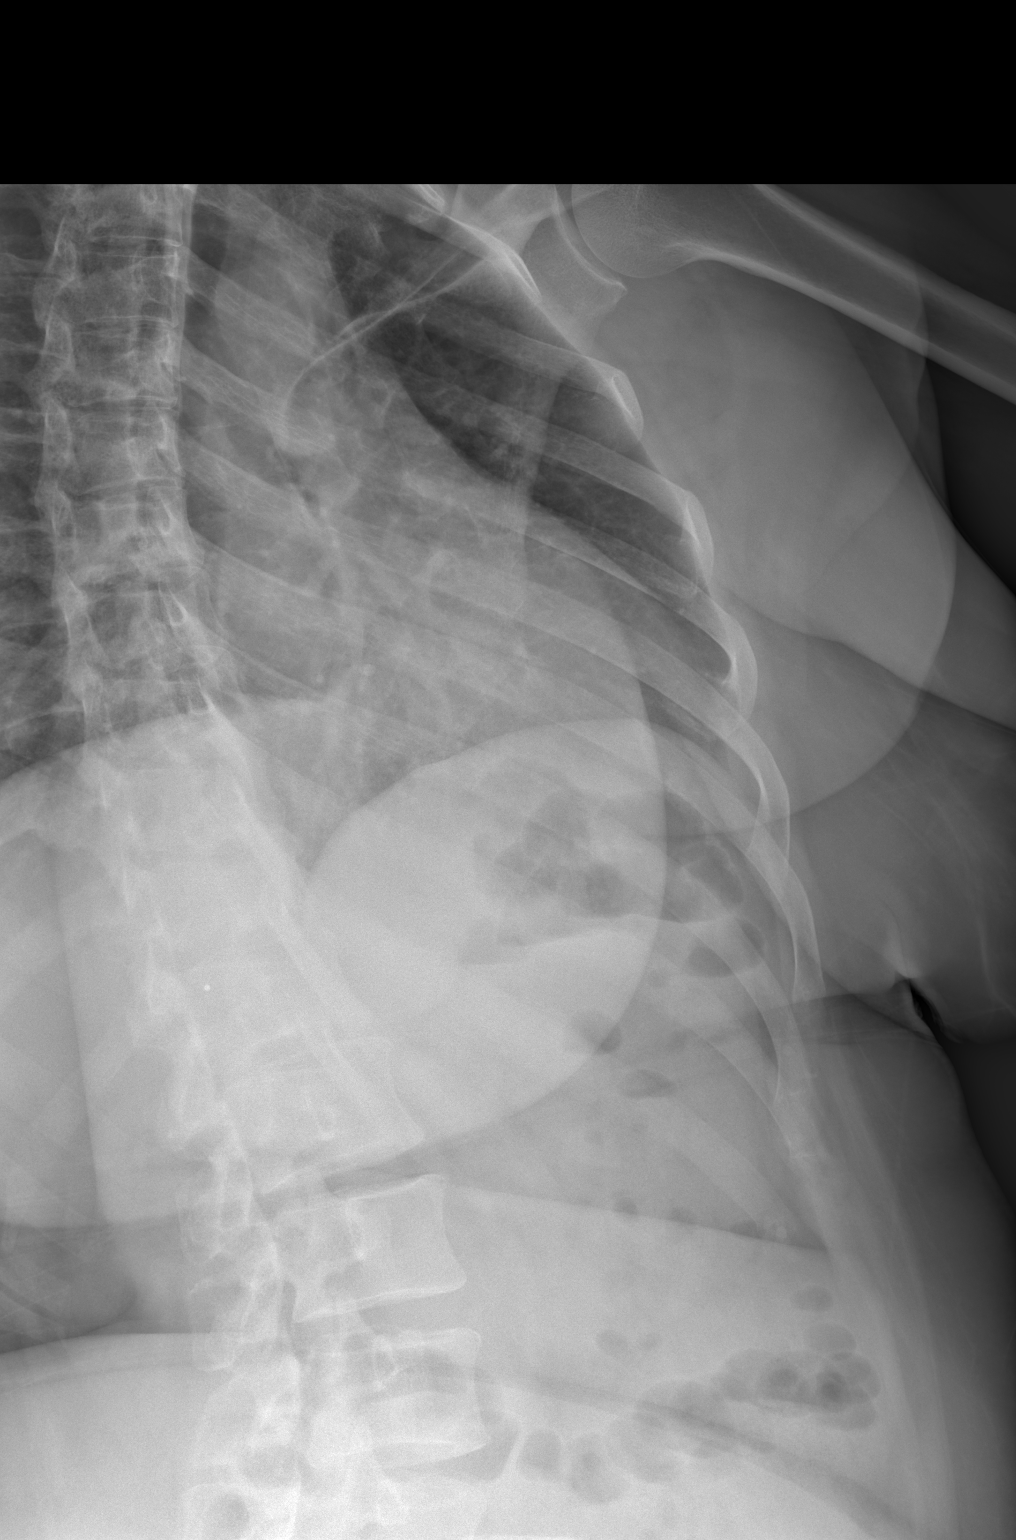

[3 of 3 positions shown; findings below may reference images not displayed]

FINDINGS: No fracture or other bone lesions are seen involving the ribs. There
is no evidence of pneumothorax or pleural effusion. Both lungs are
clear. Heart size and mediastinal contours are within normal limits.
IMPRESSION: Negative.

## 2023-02-15 DIAGNOSIS — Z13220 Encounter for screening for lipoid disorders: Secondary | ICD-10-CM | POA: Diagnosis not present

## 2023-02-15 DIAGNOSIS — Z131 Encounter for screening for diabetes mellitus: Secondary | ICD-10-CM | POA: Diagnosis not present

## 2023-02-15 DIAGNOSIS — Z1231 Encounter for screening mammogram for malignant neoplasm of breast: Secondary | ICD-10-CM | POA: Diagnosis not present

## 2023-02-15 DIAGNOSIS — Z1331 Encounter for screening for depression: Secondary | ICD-10-CM | POA: Diagnosis not present

## 2023-02-15 DIAGNOSIS — Z01419 Encounter for gynecological examination (general) (routine) without abnormal findings: Secondary | ICD-10-CM | POA: Diagnosis not present

## 2023-02-15 DIAGNOSIS — Z1329 Encounter for screening for other suspected endocrine disorder: Secondary | ICD-10-CM | POA: Diagnosis not present

## 2023-02-15 DIAGNOSIS — R300 Dysuria: Secondary | ICD-10-CM | POA: Diagnosis not present

## 2023-02-15 DIAGNOSIS — Z0000 Encounter for general adult medical examination without abnormal findings: Secondary | ICD-10-CM | POA: Diagnosis not present

## 2023-03-11 DIAGNOSIS — N390 Urinary tract infection, site not specified: Secondary | ICD-10-CM | POA: Diagnosis not present

## 2023-03-11 DIAGNOSIS — K5900 Constipation, unspecified: Secondary | ICD-10-CM | POA: Diagnosis not present

## 2023-03-11 DIAGNOSIS — R1032 Left lower quadrant pain: Secondary | ICD-10-CM | POA: Diagnosis not present

## 2023-03-11 DIAGNOSIS — R109 Unspecified abdominal pain: Secondary | ICD-10-CM | POA: Diagnosis not present

## 2023-03-13 ENCOUNTER — Other Ambulatory Visit: Payer: Self-pay | Admitting: Cardiology

## 2023-04-15 ENCOUNTER — Other Ambulatory Visit (HOSPITAL_COMMUNITY): Payer: Self-pay

## 2023-04-15 MED ORDER — PANTOPRAZOLE SODIUM 40 MG PO TBEC
40.0000 mg | DELAYED_RELEASE_TABLET | Freq: Two times a day (BID) | ORAL | 1 refills | Status: DC
  Filled 2023-04-15: qty 60, 30d supply, fill #0

## 2023-04-15 MED ORDER — VEOZAH 45 MG PO TABS
45.0000 mg | ORAL_TABLET | Freq: Every day | ORAL | 2 refills | Status: DC
  Filled 2023-04-15: qty 30, 30d supply, fill #0

## 2023-04-15 MED ORDER — ESCITALOPRAM OXALATE 10 MG PO TABS
10.0000 mg | ORAL_TABLET | Freq: Every day | ORAL | 3 refills | Status: DC
  Filled 2023-04-15: qty 30, 30d supply, fill #0

## 2023-04-15 MED ORDER — IBUPROFEN 800 MG PO TABS
800.0000 mg | ORAL_TABLET | Freq: Every day | ORAL | 1 refills | Status: DC | PRN
  Filled 2023-04-15: qty 30, 30d supply, fill #0

## 2023-04-15 MED ORDER — METOPROLOL SUCCINATE ER 25 MG PO TB24
25.0000 mg | ORAL_TABLET | Freq: Every day | ORAL | 1 refills | Status: DC
  Filled 2023-04-15: qty 30, 30d supply, fill #0

## 2023-04-16 ENCOUNTER — Other Ambulatory Visit (HOSPITAL_COMMUNITY): Payer: Self-pay

## 2023-04-22 ENCOUNTER — Other Ambulatory Visit (HOSPITAL_COMMUNITY): Payer: Self-pay

## 2023-04-26 ENCOUNTER — Other Ambulatory Visit (HOSPITAL_COMMUNITY): Payer: Self-pay

## 2023-05-02 ENCOUNTER — Emergency Department (HOSPITAL_COMMUNITY)

## 2023-05-02 ENCOUNTER — Emergency Department (HOSPITAL_COMMUNITY)
Admission: EM | Admit: 2023-05-02 | Discharge: 2023-05-02 | Disposition: A | Discharge status: Home / Self Care | Attending: Emergency Medicine | Admitting: Emergency Medicine

## 2023-05-02 ENCOUNTER — Telehealth: Payer: Self-pay | Admitting: Nurse Practitioner

## 2023-05-02 ENCOUNTER — Other Ambulatory Visit: Payer: Self-pay

## 2023-05-02 DIAGNOSIS — R002 Palpitations: Secondary | ICD-10-CM | POA: Diagnosis not present

## 2023-05-02 DIAGNOSIS — Z91040 Latex allergy status: Secondary | ICD-10-CM | POA: Insufficient documentation

## 2023-05-02 DIAGNOSIS — R918 Other nonspecific abnormal finding of lung field: Secondary | ICD-10-CM | POA: Diagnosis not present

## 2023-05-02 DIAGNOSIS — R079 Chest pain, unspecified: Secondary | ICD-10-CM | POA: Diagnosis not present

## 2023-05-02 DIAGNOSIS — R0789 Other chest pain: Secondary | ICD-10-CM | POA: Insufficient documentation

## 2023-05-02 LAB — CBC
HCT: 38.5 % (ref 36.0–46.0)
Hemoglobin: 12 g/dL (ref 12.0–15.0)
MCH: 28.5 pg (ref 26.0–34.0)
MCHC: 31.2 g/dL (ref 30.0–36.0)
MCV: 91.4 fL (ref 80.0–100.0)
Platelets: 290 10*3/uL (ref 150–400)
RBC: 4.21 MIL/uL (ref 3.87–5.11)
RDW: 12.5 % (ref 11.5–15.5)
WBC: 6.2 10*3/uL (ref 4.0–10.5)
nRBC: 0 % (ref 0.0–0.2)

## 2023-05-02 LAB — BASIC METABOLIC PANEL WITH GFR
Anion gap: 8 (ref 5–15)
BUN: 8 mg/dL (ref 6–20)
CO2: 25 mmol/L (ref 22–32)
Calcium: 8.7 mg/dL — ABNORMAL LOW (ref 8.9–10.3)
Chloride: 106 mmol/L (ref 98–111)
Creatinine, Ser: 0.76 mg/dL (ref 0.44–1.00)
GFR, Estimated: >60 mL/min (ref >60)
Glucose, Bld: 103 mg/dL — ABNORMAL HIGH (ref 70–99)
Potassium: 3.8 mmol/L (ref 3.5–5.1)
Sodium: 139 mmol/L (ref 135–145)

## 2023-05-02 LAB — TROPONIN I (HIGH SENSITIVITY)
Troponin I (High Sensitivity): <2 ng/L (ref <18)
Troponin I (High Sensitivity): <2 ng/L (ref <18)

## 2023-05-02 NOTE — Telephone Encounter (Signed)
 Spoke with pt and reviewed Dr. Norris Cross last OV note and the recommendations from that visit. Pt's current symptoms are the same from that prior visit in 2023. Pt reported by Dr. Mayford Knife to not be taking Toprol-XL daily and her recommendations were to take daily to help with symptoms. Pt verified she is currently not taking the medication daily as advised. Explained to pt that it is very important that she takes this medication daily because it will help with the fast HR and her elevated BP. Pt verbalized understanding and stated she would be taking it daily now. Pt still wanted to be seen and was given an appt with Tereso Newcomer, PA-C for 4/7. ED precautions discussed with pt if symptoms seem to not get better or get worse. Pt verbalized understanding of plan and was very thankful.

## 2023-05-02 NOTE — Telephone Encounter (Signed)
 Patient c/o Palpitations:  STAT if patient reporting lightheadedness, shortness of breath, or chest pain  How long have you had palpitations/irregular HR/ Afib? Are you having the symptoms now? Heart racing all the time at night  Are you currently experiencing lightheadedness, SOB or CP? Last night was short of breath, some chest pains off and on- not at this time  Do you have a history of afib (atrial fibrillation) or irregular heart rhythm?   Have you checked your BP or HR? (document readings if available): blood pressure have been elevated some  Are you experiencing any other symptoms? No- patient would like to have an appointment to be seen

## 2023-05-02 NOTE — ED Triage Notes (Signed)
 Patient arrives for eval of four days of intermittent central chest pain with radiation to LUE. Also reports palpitations/feeling like her heart is racing in the evenings. Patient prescribed Metoprolol for HTN but has no taken it in months because she thought her blood pressure was doing better and she would be okay without it. Resumed taking it this morning but hypertensive in triage.

## 2023-05-02 NOTE — Discharge Instructions (Signed)
 Return for any problem.  Follow-up with Dr. Mayford Knife on Monday as instructed.

## 2023-05-02 NOTE — ED Provider Notes (Signed)
 [place] EMERGENCY DEPARTMENT AT [facility] Provider Note   CSN: [id number] Arrival date & time: 05/02/23  1750     History  Chief Complaint  Patient presents with   Chest Pain   Palpitations    Michelle Hanson is a 58 y.o. female.  58 year old female with prior medical history as detailed below presents for evaluation.  Patient reports intermittent palpitations and central chest discomfort x 2 to 3 days.  Patient reports that her symptoms were worse today so she was more concerned and decided to come to the ED.  At the time of my evaluation she feels improved.  She denies active chest pain.  She reports that she has made an appointment with her cardiologist for this Monday.  The history is provided by the patient.       Home Medications Prior to Admission medications   Medication Sig Start Date End Date Taking? Authorizing Provider  escitalopram (LEXAPRO) 10 MG tablet Take 1 tablet (10 mg total) by mouth daily. 03/03/23   D'Iorio, Antony Salmon, MD  Fezolinetant (VEOZAH) 45 MG TABS Take 1 tablet (45 mg total) by mouth daily. 02/15/23   D'Iorio, Antony Salmon, MD  ibuprofen (ADVIL) 800 MG tablet Take 1 tablet (800 mg total) by mouth daily as needed. 04/01/23     ibuprofen (ADVIL,MOTRIN) 800 MG tablet Take 1 tablet (800 mg total) by mouth 3 (three) times daily. 05/16/12   Elpidio Anis, PA-C  LINZESS 72 MCG capsule Take 72 mcg by mouth every morning. 12/20/20   [provider]  LORazepam (ATIVAN) 1 MG tablet Take 1 mg by mouth daily as needed. 06/27/21   [provider]  metoprolol succinate (TOPROL-XL) 25 MG 24 hr tablet TAKE 1 TABLET (25 MG TOTAL) BY MOUTH DAILY. 03/13/23   Quintella Reichert, MD  metoprolol succinate (TOPROL-XL) 25 MG 24 hr tablet Take 1 tablet (25 mg total) by mouth daily. 03/13/23   Quintella Reichert, MD  Multiple Vitamin (MULTIVITAMIN) tablet Take 1 tablet by mouth daily.    [provider]  nystatin-triamcinolone ointment (MYCOLOG) Apply  topically as needed. 03/28/21   [provider]  pantoprazole (PROTONIX) 40 MG tablet Take 40 mg by mouth daily at 6 (six) AM. 12/23/20   [provider]  pantoprazole (PROTONIX) 40 MG tablet Take 1 tablet (40 mg total) by mouth 2 (two) times daily. 04/08/23         Allergies    Latex    Review of Systems   Review of Systems  All other systems reviewed and are negative.   Physical Exam Updated Vital Signs BP (!) 166/89   Pulse 71   Temp 98.3 F (36.8 C) (Oral)   Resp 17   LMP 01/03/2011   SpO2 100%  Physical Exam Vitals and nursing note reviewed.  Constitutional:      General: She is not in acute distress.    Appearance: Normal appearance. She is well-developed.  HENT:     Head: Normocephalic and atraumatic.  Eyes:     Conjunctiva/sclera: Conjunctivae normal.     Pupils: Pupils are equal, round, and reactive to light.  Cardiovascular:     Rate and Rhythm: Normal rate and regular rhythm.     Heart sounds: Normal heart sounds.  Pulmonary:     Effort: Pulmonary effort is normal. No respiratory distress.     Breath sounds: Normal breath sounds.  Abdominal:     General: There is no distension.  Palpations: Abdomen is soft.     Tenderness: There is no abdominal tenderness.  Musculoskeletal:        General: No deformity. Normal range of motion.     Cervical back: Normal range of motion and neck supple.  Skin:    General: Skin is warm and dry.  Neurological:     General: No focal deficit present.     Mental Status: She is alert and oriented to person, place, and time.     ED Results / Procedures / Treatments   Labs (all labs ordered are listed, but only abnormal results are displayed) Labs Reviewed  BASIC METABOLIC PANEL WITH GFR - Abnormal; Notable for the following components:      Result Value   Glucose, Bld 103 (*)    Calcium 8.7 (*)    All other components within normal limits  CBC  TROPONIN I (HIGH SENSITIVITY)  TROPONIN I (HIGH  SENSITIVITY)    EKG EKG Interpretation Date/Time:  Thursday May 02 2023 17:58:00 EDT Ventricular Rate:  54 PR Interval:  162 QRS Duration:  84 QT Interval:  404 QTC Calculation: 383 R Axis:   22  Text Interpretation: Sinus bradycardia Otherwise normal ECG No previous ECGs available Confirmed by Kristine Royal [phone]) on 05/02/2023 7:14:44 PM  Radiology DG Chest 2 View Result Date: 05/02/2023 CLINICAL DATA:  Chest pain and palpitation. EXAM: CHEST - 2 VIEW COMPARISON:  Chest radiograph dated 12/20/2020. FINDINGS: Faint bilateral interstitial densities may represent atelectasis. Atypical infiltrate is less likely but not excluded. No focal consolidation, pleural effusion or pneumothorax. Cardiac silhouette is within normal limits. No acute osseous pathology IMPRESSION: Faint bilateral interstitial densities may represent atelectasis. Atypical infiltrate is less likely but not excluded. Electronically Signed   By: Elgie Collard M.D.   On: 05/02/2023 18:41    Procedures Procedures    Medications Ordered in ED Medications - No data to display  ED Course/ Medical Decision Making/ A&P                                 Medical Decision Making Amount and/or Complexity of Data Reviewed Labs: ordered. Radiology: ordered.    Medical Screen Complete  This patient presented to the ED with complaint of chest discomfort, palpitations.  This complaint involves an extensive number of treatment options. The initial differential diagnosis includes, but is not limited to, ACS, angina, arrhythmia, etc.  This presentation is: Acute, Chronic, Self-Limited, Previously Undiagnosed, Uncertain Prognosis, Complicated, Systemic Symptoms, and Threat to Life/Bodily Function  Patient is presenting with complaint of atypical chest discomfort and palpitations.  Described symptoms are not entirely consistent with likely ACS.  Obtain EKG is without evidence of acute ischemia.  Initial troponins are  without significant elevation or delta.  Other obtained labs are without significant abnormality.  Patient feels much improved and reassured after evaluation.  She understands need for close outpatient follow-up.  Strict return precautions given and understood.  Co morbidities that complicated the patient's evaluation  See HPI   Additional history obtained:   External records from outside sources obtained and reviewed including prior ED visits and prior Inpatient records.     Problem List / ED Course:  Atypical chest discomfort, palpitations   Reevaluation:  After the interventions noted above, I reevaluated the patient and found that they have: resolved    Disposition:  After consideration of the diagnostic results and the patients response to treatment, I  feel that the patent would benefit from close outpatient follow-up.          Final Clinical Impression(s) / ED Diagnoses Final diagnoses:  Palpitations    Rx / DC Orders ED Discharge Orders     None         Wynetta Fines, MD 05/02/23 2344

## 2023-05-03 ENCOUNTER — Other Ambulatory Visit (HOSPITAL_COMMUNITY): Payer: Self-pay

## 2023-05-03 MED ORDER — LORAZEPAM 1 MG PO TABS
1.0000 mg | ORAL_TABLET | Freq: Every day | ORAL | 0 refills | Status: AC | PRN
  Filled 2023-05-03: qty 30, 30d supply, fill #0

## 2023-05-06 ENCOUNTER — Encounter: Payer: Self-pay | Admitting: Physician Assistant

## 2023-05-06 ENCOUNTER — Ambulatory Visit: Attending: Physician Assistant | Admitting: Physician Assistant

## 2023-05-06 ENCOUNTER — Other Ambulatory Visit (HOSPITAL_COMMUNITY): Payer: Self-pay

## 2023-05-06 VITALS — BP 150/70 | HR 57 | Ht 64.0 in | Wt 190.0 lb

## 2023-05-06 DIAGNOSIS — R002 Palpitations: Secondary | ICD-10-CM | POA: Diagnosis not present

## 2023-05-06 DIAGNOSIS — F419 Anxiety disorder, unspecified: Secondary | ICD-10-CM | POA: Diagnosis not present

## 2023-05-06 DIAGNOSIS — I10 Essential (primary) hypertension: Secondary | ICD-10-CM | POA: Diagnosis not present

## 2023-05-06 DIAGNOSIS — R072 Precordial pain: Secondary | ICD-10-CM | POA: Diagnosis not present

## 2023-05-06 MED ORDER — AMLODIPINE BESYLATE 2.5 MG PO TABS: 2.5000 mg | ORAL_TABLET | Freq: Every day | ORAL | 3 refills | Status: DC

## 2023-05-06 MED ORDER — AMLODIPINE BESYLATE 2.5 MG PO TABS
2.5000 mg | ORAL_TABLET | Freq: Every day | ORAL | 3 refills | Status: AC
  Filled 2023-05-06: qty 90, 90d supply, fill #0

## 2023-05-06 NOTE — Patient Instructions (Signed)
 Medication Instructions:  Your physician has recommended you make the following change in your medication:  START Amlodipine 2.5 mg taking 1 daily   *If you need a refill on your cardiac medications before your next appointment, please call your pharmacy*  Lab Work: None ordered If you have labs (blood work) drawn today and your tests are completely normal, you will receive your results only by: MyChart Message (if you have MyChart) OR A paper copy in the mail If you have any lab test that is abnormal or we need to change your treatment, we will call you to review the results.  Testing/Procedures: Your physician has requested that you have an echocardiogram. Echocardiography is a painless test that uses sound waves to create images of your heart. It provides your doctor with information about the size and shape of your heart and how well your heart's chambers and valves are working. This procedure takes approximately one hour. There are no restrictions for this procedure. Please do NOT wear cologne, perfume, aftershave, or lotions (deodorant is allowed). Please arrive 15 minutes prior to your appointment time.  Please note: We ask at that you not bring children with you during ultrasound (echo/ vascular) testing. Due to room size and safety concerns, children are not allowed in the ultrasound rooms during exams. Our front office staff cannot provide observation of children in our lobby area while testing is being conducted. An adult accompanying a patient to their appointment will only be allowed in the ultrasound room at the discretion of the ultrasound technician under special circumstances. We apologize for any inconvenience.      Please report to Radiology at the [facility] Main Entrance 30 minutes early for your test.  [address] [place], [place] [address]                         OR   Please report to Radiology at [facility] Main Entrance,  medical mall, 30 mins prior to your test.  [address]  [place], [place]  How to Prepare for Your Cardiac PET/CT Stress Test:  Nothing to eat or drink, except water, 3 hours prior to arrival time.  NO caffeine/decaffeinated products, or chocolate 12 hours prior to arrival. (Please note decaffeinated beverages (teas/coffees) still contain caffeine).  If you have caffeine within 12 hours prior, the test will need to be rescheduled.  Medication instructions: Do not take erectile dysfunction medications for 72 hours prior to test (sildenafil, tadalafil) Do not take nitrates (isosorbide mononitrate, Ranexa) the day before or day of test Do not take tamsulosin the day before or morning of test Hold theophylline containing medications for 12 hours. Hold Dipyridamole 48 hours prior to the test.  Diabetic Preparation: If able to eat breakfast prior to 3 hour fasting, you may take all medications, including your insulin. Do not worry if you miss your breakfast dose of insulin - start at your next meal. If you do not eat prior to 3 hour fast-Hold all diabetes (oral and insulin) medications. Patients who wear a continuous glucose monitor MUST remove the device prior to scanning.  You may take your remaining medications with water.  NO perfume, cologne or lotion on chest or abdomen area. FEMALES - Please avoid wearing dresses to this appointment.  Total time is 1 to 2 hours; you may want to bring reading material for the waiting time.  IF YOU THINK YOU MAY BE PREGNANT, OR  ARE NURSING PLEASE INFORM THE TECHNOLOGIST.  In preparation for your appointment, medication and supplies will be purchased.  Appointment availability is limited, so if you need to cancel or reschedule, please call the Radiology Department Scheduler at [phone] 24 hours in advance to avoid a cancellation fee of $100.00  What to Expect When you Arrive:  Once you arrive and check in for your appointment, you will be  taken to a preparation room within the Radiology Department.  A technologist or Nurse will obtain your medical history, verify that you are correctly prepped for the exam, and explain the procedure.  Afterwards, an IV will be started in your arm and electrodes will be placed on your skin for EKG monitoring during the stress portion of the exam. Then you will be escorted to the PET/CT scanner.  There, staff will get you positioned on the scanner and obtain a blood pressure and EKG.  During the exam, you will continue to be connected to the EKG and blood pressure machines.  A small, safe amount of a radioactive tracer will be injected in your IV to obtain a series of pictures of your heart along with an injection of a stress agent.    After your Exam:  It is recommended that you eat a meal and drink a caffeinated beverage to counter act any effects of the stress agent.  Drink plenty of fluids for the remainder of the day and urinate frequently for the first couple of hours after the exam.  Your doctor will inform you of your test results within 7-10 business days.  For more information and frequently asked questions, please visit our website: [email or web address]  For questions about your test or how to prepare for your test, please call: Cardiac Imaging Nurse Navigators Office: [phone]   Follow-Up: At [facility], you and your health needs are our priority.  As part of our continuing mission to provide you with exceptional heart care, our providers are all part of one team.  This team includes your primary Cardiologist (physician) and Advanced Practice Providers or APPs (Physician Assistants and Nurse Practitioners) who all work together to provide you with the care you need, when you need it.  Your next appointment:   6 month(s)  Provider:   Armanda Magic, MD     We recommend signing up for the patient portal called "MyChart".  Sign up information is provided on  this After Visit Summary.  MyChart is used to connect with patients for Virtual Visits (Telemedicine).  Patients are able to view lab/test results, encounter notes, upcoming appointments, etc.  Non-urgent messages can be sent to your provider as well.   To learn more about what you can do with MyChart, go to [email or web address].   Other Instructions       1st Floor: - Lobby - Registration  - Pharmacy  - Lab - Cafe  2nd Floor: - PV Lab - Diagnostic Testing (echo, CT, nuclear med)  3rd Floor: - Vacant  4th Floor: - TCTS (cardiothoracic surgery) - AFib Clinic - Structural Heart Clinic - Vascular Surgery  - Vascular Ultrasound  5th Floor: - HeartCare Cardiology (general and EP) - Clinical Pharmacy for coumadin, hypertension, lipid, weight-loss medications, and med management appointments    Valet parking services will be available as well.

## 2023-05-06 NOTE — Assessment & Plan Note (Addendum)
 Anxiety is likely contributing to chest pain and palpitations.  - Follow-up with primary care physician about adjusting anxiety medications
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
## 2023-05-07 DIAGNOSIS — F410 Panic disorder [episodic paroxysmal anxiety] without agoraphobia: Secondary | ICD-10-CM | POA: Diagnosis not present

## 2023-05-07 DIAGNOSIS — R030 Elevated blood-pressure reading, without diagnosis of hypertension: Secondary | ICD-10-CM | POA: Diagnosis not present

## 2023-05-07 DIAGNOSIS — Z79899 Other long term (current) drug therapy: Secondary | ICD-10-CM | POA: Diagnosis not present

## 2023-05-07 DIAGNOSIS — F419 Anxiety disorder, unspecified: Secondary | ICD-10-CM | POA: Diagnosis not present

## 2023-05-07 DIAGNOSIS — R002 Palpitations: Secondary | ICD-10-CM | POA: Diagnosis not present

## 2023-05-07 NOTE — Addendum Note (Signed)
 Addended byAlben Spittle, Lorin Picket T on: 05/07/2023 08:26 AM   Modules accepted: Orders

## 2023-05-31 ENCOUNTER — Telehealth (HOSPITAL_COMMUNITY): Payer: Self-pay | Admitting: Physician Assistant

## 2023-05-31 NOTE — Telephone Encounter (Signed)
 Patient cancelled  echocardiogram scheduled for 06/07/23 and does not wish to reschedule at this time. Order will be removed from the active echo WQ. If patient calls back we will reinstate order. Thank you.

## 2023-05-31 NOTE — Telephone Encounter (Signed)
 [address] [place], [place]    05/31/2023 5:20 PM

## 2023-05-31 NOTE — Telephone Encounter (Signed)
 Will route this message to the ordering Provider and covering, as a general FYI.

## 2023-06-07 ENCOUNTER — Ambulatory Visit (HOSPITAL_COMMUNITY)

## 2023-07-13 ENCOUNTER — Other Ambulatory Visit (HOSPITAL_BASED_OUTPATIENT_CLINIC_OR_DEPARTMENT_OTHER): Payer: Self-pay

## 2023-07-31 ENCOUNTER — Encounter (HOSPITAL_COMMUNITY)

## 2023-10-01 DIAGNOSIS — H5213 Myopia, bilateral: Secondary | ICD-10-CM | POA: Diagnosis not present

## 2023-10-18 ENCOUNTER — Ambulatory Visit: Admitting: Cardiology

## 2023-11-01 ENCOUNTER — Other Ambulatory Visit (HOSPITAL_COMMUNITY): Payer: Self-pay

## 2023-11-01 DIAGNOSIS — K219 Gastro-esophageal reflux disease without esophagitis: Secondary | ICD-10-CM | POA: Diagnosis not present

## 2023-11-01 DIAGNOSIS — K5901 Slow transit constipation: Secondary | ICD-10-CM | POA: Diagnosis not present

## 2023-11-01 DIAGNOSIS — M549 Dorsalgia, unspecified: Secondary | ICD-10-CM | POA: Diagnosis not present

## 2023-11-01 DIAGNOSIS — R1012 Left upper quadrant pain: Secondary | ICD-10-CM | POA: Diagnosis not present

## 2023-11-01 MED ORDER — LINZESS 72 MCG PO CAPS
72.0000 ug | ORAL_CAPSULE | Freq: Every day | ORAL | 0 refills | Status: AC | PRN
  Filled 2023-11-01: qty 30, 30d supply, fill #0

## 2023-11-05 ENCOUNTER — Other Ambulatory Visit (HOSPITAL_COMMUNITY): Payer: Self-pay

## 2023-11-05 ENCOUNTER — Other Ambulatory Visit: Payer: Self-pay

## 2023-11-05 DIAGNOSIS — R1012 Left upper quadrant pain: Secondary | ICD-10-CM | POA: Diagnosis not present

## 2023-11-05 DIAGNOSIS — K5909 Other constipation: Secondary | ICD-10-CM | POA: Diagnosis not present

## 2023-11-05 DIAGNOSIS — R109 Unspecified abdominal pain: Secondary | ICD-10-CM | POA: Diagnosis not present

## 2023-11-05 DIAGNOSIS — R112 Nausea with vomiting, unspecified: Secondary | ICD-10-CM | POA: Diagnosis not present

## 2023-11-05 MED ORDER — PANTOPRAZOLE SODIUM 40 MG PO TBEC
40.0000 mg | DELAYED_RELEASE_TABLET | Freq: Every morning | ORAL | 1 refills | Status: AC
  Filled 2023-11-05 (×2): qty 30, 30d supply, fill #0

## 2023-11-05 MED ORDER — LINZESS 72 MCG PO CAPS
72.0000 ug | ORAL_CAPSULE | Freq: Every morning | ORAL | 3 refills | Status: AC
  Filled 2023-11-05: qty 30, 30d supply, fill #0

## 2023-11-26 DIAGNOSIS — H40013 Open angle with borderline findings, low risk, bilateral: Secondary | ICD-10-CM | POA: Diagnosis not present

## 2023-12-10 ENCOUNTER — Other Ambulatory Visit (HOSPITAL_COMMUNITY): Payer: Self-pay

## 2023-12-10 DIAGNOSIS — I10 Essential (primary) hypertension: Secondary | ICD-10-CM | POA: Diagnosis not present

## 2023-12-10 DIAGNOSIS — N390 Urinary tract infection, site not specified: Secondary | ICD-10-CM | POA: Diagnosis not present

## 2023-12-10 MED ORDER — CIPROFLOXACIN HCL 500 MG PO TABS
500.0000 mg | ORAL_TABLET | Freq: Two times a day (BID) | ORAL | 0 refills | Status: AC
  Filled 2023-12-10: qty 14, 7d supply, fill #0

## 2024-01-14 ENCOUNTER — Other Ambulatory Visit (HOSPITAL_COMMUNITY): Payer: Self-pay

## 2024-01-14 DIAGNOSIS — R030 Elevated blood-pressure reading, without diagnosis of hypertension: Secondary | ICD-10-CM | POA: Diagnosis not present

## 2024-01-14 DIAGNOSIS — M546 Pain in thoracic spine: Secondary | ICD-10-CM | POA: Diagnosis not present

## 2024-01-14 DIAGNOSIS — M5412 Radiculopathy, cervical region: Secondary | ICD-10-CM | POA: Diagnosis not present

## 2024-01-14 DIAGNOSIS — R202 Paresthesia of skin: Secondary | ICD-10-CM | POA: Diagnosis not present

## 2024-01-14 MED ORDER — PREDNISONE 10 MG PO TABS
ORAL_TABLET | ORAL | 0 refills | Status: AC
  Filled 2024-01-14: qty 42, 12d supply, fill #0

## 2024-01-14 MED ORDER — METHOCARBAMOL 750 MG PO TABS
750.0000 mg | ORAL_TABLET | Freq: Three times a day (TID) | ORAL | 0 refills | Status: AC | PRN
  Filled 2024-01-14: qty 30, 10d supply, fill #0

## 2024-01-17 ENCOUNTER — Encounter (HOSPITAL_COMMUNITY): Payer: Self-pay | Admitting: Emergency Medicine

## 2024-01-17 ENCOUNTER — Other Ambulatory Visit (HOSPITAL_COMMUNITY): Payer: Self-pay

## 2024-01-17 ENCOUNTER — Emergency Department (HOSPITAL_COMMUNITY)
Admission: EM | Admit: 2024-01-17 | Discharge: 2024-01-17 | Disposition: A | Discharge status: Home / Self Care | Attending: Emergency Medicine | Admitting: Emergency Medicine

## 2024-01-17 ENCOUNTER — Emergency Department (HOSPITAL_COMMUNITY)

## 2024-01-17 DIAGNOSIS — M549 Dorsalgia, unspecified: Secondary | ICD-10-CM | POA: Diagnosis not present

## 2024-01-17 DIAGNOSIS — Z91040 Latex allergy status: Secondary | ICD-10-CM | POA: Insufficient documentation

## 2024-01-17 DIAGNOSIS — I10 Essential (primary) hypertension: Secondary | ICD-10-CM | POA: Insufficient documentation

## 2024-01-17 DIAGNOSIS — R0602 Shortness of breath: Secondary | ICD-10-CM | POA: Insufficient documentation

## 2024-01-17 DIAGNOSIS — M5412 Radiculopathy, cervical region: Secondary | ICD-10-CM | POA: Diagnosis not present

## 2024-01-17 DIAGNOSIS — M4802 Spinal stenosis, cervical region: Secondary | ICD-10-CM | POA: Diagnosis not present

## 2024-01-17 DIAGNOSIS — M4722 Other spondylosis with radiculopathy, cervical region: Secondary | ICD-10-CM | POA: Diagnosis not present

## 2024-01-17 DIAGNOSIS — M4314 Spondylolisthesis, thoracic region: Secondary | ICD-10-CM | POA: Diagnosis not present

## 2024-01-17 DIAGNOSIS — Z79899 Other long term (current) drug therapy: Secondary | ICD-10-CM | POA: Diagnosis not present

## 2024-01-17 LAB — CBC
HCT: 38.3 % (ref 36.0–46.0)
Hemoglobin: 12.2 g/dL (ref 12.0–15.0)
MCH: 28.9 pg (ref 26.0–34.0)
MCHC: 31.9 g/dL (ref 30.0–36.0)
MCV: 90.8 fL (ref 80.0–100.0)
Platelets: 283 K/uL (ref 150–400)
RBC: 4.22 MIL/uL (ref 3.87–5.11)
RDW: 12.4 % (ref 11.5–15.5)
WBC: 6.9 K/uL (ref 4.0–10.5)
nRBC: 0 % (ref 0.0–0.2)

## 2024-01-17 LAB — BASIC METABOLIC PANEL WITH GFR
Anion gap: 9 (ref 5–15)
BUN: 9 mg/dL (ref 6–20)
CO2: 24 mmol/L (ref 22–32)
Calcium: 9.5 mg/dL (ref 8.9–10.3)
Chloride: 103 mmol/L (ref 98–111)
Creatinine, Ser: 0.81 mg/dL (ref 0.44–1.00)
GFR, Estimated: >60 mL/min
Glucose, Bld: 101 mg/dL — ABNORMAL HIGH (ref 70–99)
Potassium: 4.5 mmol/L (ref 3.5–5.1)
Sodium: 136 mmol/L (ref 135–145)

## 2024-01-17 LAB — TROPONIN T, HIGH SENSITIVITY
Troponin T High Sensitivity: <15 ng/L (ref 0–19)
Troponin T High Sensitivity: <15 ng/L (ref 0–19)

## 2024-01-17 MED ORDER — MORPHINE SULFATE (PF) 4 MG/ML IV SOLN
4.0000 mg | Freq: Once | INTRAVENOUS | Status: AC
  Administered 2024-01-17: 4 mg via INTRAVENOUS
  Filled 2024-01-17: qty 1

## 2024-01-17 MED ORDER — OXYCODONE-ACETAMINOPHEN 5-325 MG PO TABS
1.0000 | ORAL_TABLET | Freq: Four times a day (QID) | ORAL | 0 refills | Status: AC | PRN
  Filled 2024-01-17: qty 15, 4d supply, fill #0

## 2024-01-17 MED ORDER — ONDANSETRON HCL 4 MG/2ML IJ SOLN
4.0000 mg | Freq: Once | INTRAMUSCULAR | Status: AC
  Administered 2024-01-17: 4 mg via INTRAVENOUS
  Filled 2024-01-17: qty 2

## 2024-01-17 MED ORDER — KETOROLAC TROMETHAMINE 30 MG/ML IJ SOLN
30.0000 mg | Freq: Once | INTRAMUSCULAR | Status: AC
  Administered 2024-01-17: 30 mg via INTRAVENOUS
  Filled 2024-01-17: qty 1

## 2024-01-17 MED ORDER — CELECOXIB 200 MG PO CAPS
200.0000 mg | ORAL_CAPSULE | Freq: Two times a day (BID) | ORAL | 0 refills | Status: AC
  Filled 2024-01-17: qty 60, 30d supply, fill #0

## 2024-01-17 MED ORDER — IOHEXOL 350 MG/ML SOLN
75.0000 mL | Freq: Once | INTRAVENOUS | Status: AC | PRN
  Administered 2024-01-17: 75 mL via INTRAVENOUS

## 2024-01-17 NOTE — ED Provider Notes (Signed)
 " [facility] Provider Note   CSN: [id number] Arrival date & time: 01/17/24  9296     Patient presents with: Back Pain and Shortness of Breath   Michelle Hanson is a 58 y.o. female.   Pt is a 58 yo female with pmhx significant for htn and anxiety.  Pt said she's had upper back pain for about 3 weeks.  She has also had sob which is worse at night.  She just finished up a prednisone  taper and robaxin .  This did not help at all.  She's not had f/c.  No cough.  She has pain in both arms.  She said sx are worse with deep breaths.       Prior to Admission medications  Medication Sig Start Date End Date Taking? Authorizing Provider  celecoxib (CELEBREX) 200 MG capsule Take 1 capsule (200 mg total) by mouth 2 (two) times daily. 01/17/24  Yes Dean Clarity, MD  oxyCODONE -acetaminophen  (PERCOCET/ROXICET) 5-325 MG tablet Take 1 tablet by mouth every 6 (six) hours as needed for severe pain (pain score 7-10). 01/17/24  Yes Dean Clarity, MD  amLODipine  (NORVASC ) 2.5 MG tablet Take 1 tablet (2.5 mg total) by mouth daily. 05/06/23 08/04/23  Lelon Hamilton T, PA-C  ciprofloxacin  (CIPRO ) 500 MG tablet Take 1 tablet (500 mg total) by mouth every 12 (twelve) hours. 12/10/23     escitalopram  (LEXAPRO ) 10 MG tablet Take 10 mg by mouth as needed (for depression/anxity).    [provider]  ibuprofen  (ADVIL ) 800 MG tablet Take 800 mg by mouth every 8 (eight) hours as needed for headache, mild pain (pain score 1-3) or moderate pain (pain score 4-6).    [provider]  linaclotide  (LINZESS ) 72 MCG capsule Take 1 capsule (72 mcg total) by mouth daily as needed. 11/01/23     linaclotide  (LINZESS ) 72 MCG capsule Take 1 capsule (72 mcg total) by mouth in the morning 30 minutes prior to breakfast 11/05/23     LINZESS  72 MCG capsule Take 72 mcg by mouth as needed (for constipation). 12/20/20   [provider]  LORazepam  (ATIVAN ) 1 MG tablet Take 1  tablet (1 mg total) by mouth daily as needed for anxiety 05/03/23     methocarbamol  (ROBAXIN ) 750 MG tablet Take 1 tablet (750 mg total) by mouth 3 (three) times daily as needed. 01/14/24     metoprolol  succinate (TOPROL -XL) 25 MG 24 hr tablet TAKE 1 TABLET (25 MG TOTAL) BY MOUTH DAILY. 03/13/23   Shlomo Wilbert SAUNDERS, MD  Multiple Vitamin (MULTIVITAMIN) tablet Take 1 tablet by mouth daily.    [provider]  nystatin-triamcinolone ointment (MYCOLOG) Apply 1 Application topically as needed (for irritation). 03/28/21   [provider]  pantoprazole  (PROTONIX ) 40 MG tablet Take 40 mg by mouth as needed (for intergestion/heartburn). 12/23/20   [provider]  pantoprazole  (PROTONIX ) 40 MG tablet Take 1 tablet (40 mg total) by mouth in the morning 1/2 to 1 hour before morning meal 11/05/23     predniSONE  (DELTASONE ) 10 MG tablet Take 6 tablets (60 mg total) by mouth daily for 2 days, THEN 5 tablets (50 mg total) daily for 2 days, THEN 4 tablets (40 mg total) daily for 2 days, THEN 3 tablets (30 mg total) daily for 2 days, THEN 2 tablets (20 mg total) daily for 2 days, THEN 1 tablet (10 mg total) daily for 2 days. 01/14/24 01/26/24      Allergies: Latex  Review of Systems  Respiratory:  Positive for shortness of breath.   Musculoskeletal:  Positive for back pain.  All other systems reviewed and are negative.   Updated Vital Signs BP 126/71 (BP Location: Right Arm)   Pulse 68   Temp 97.7 F (36.5 C) (Oral)   Resp 18   Ht 5' 4 (1.626 m)   Wt 87 kg   LMP 01/03/2011   SpO2 99%   BMI 32.92 kg/m   Physical Exam Vitals and nursing note reviewed.  Constitutional:      Appearance: She is well-developed.  HENT:     Head: Normocephalic and atraumatic.     Mouth/Throat:     Mouth: Mucous membranes are moist.     Pharynx: Oropharynx is clear.  Eyes:     Extraocular Movements: Extraocular movements intact.     Pupils: Pupils are equal, round, and reactive to light.   Cardiovascular:     Rate and Rhythm: Normal rate and regular rhythm.  Pulmonary:     Effort: Pulmonary effort is normal.     Breath sounds: Normal breath sounds.  Abdominal:     General: Bowel sounds are normal.     Palpations: Abdomen is soft.  Musculoskeletal:        General: Normal range of motion.     Cervical back: Normal range of motion and neck supple.  Skin:    General: Skin is warm.     Capillary Refill: Capillary refill takes less than 2 seconds.  Neurological:     General: No focal deficit present.     Mental Status: She is alert and oriented to person, place, and time.  Psychiatric:        Mood and Affect: Mood normal.        Behavior: Behavior normal.     (all labs ordered are listed, but only abnormal results are displayed) Labs Reviewed  BASIC METABOLIC PANEL WITH GFR - Abnormal; Notable for the following components:      Result Value   Glucose, Bld 101 (*)    All other components within normal limits  CBC  TROPONIN T, HIGH SENSITIVITY  TROPONIN T, HIGH SENSITIVITY    EKG: EKG Interpretation Date/Time:  Friday January 17 2024 07:17:09 EST Ventricular Rate:  84 PR Interval:  154 QRS Duration:  82 QT Interval:  382 QTC Calculation: 451 R Axis:   35  Text Interpretation: Normal sinus rhythm Cannot rule out Anterior infarct , age undetermined Abnormal ECG When compared with ECG of 06-May-2023 15:04, PREVIOUS ECG IS PRESENT No significant change since last tracing Confirmed by Dean Clarity [phone]) on 01/17/2024 9:08:21 AM  Radiology: CT Angio Chest PE W and/or Wo Contrast Result Date: 01/17/2024 CLINICAL DATA:  Shortness of breath and upper back pain x3 weeks. EXAM: CT ANGIOGRAPHY CHEST WITH CONTRAST TECHNIQUE: Multidetector CT imaging of the chest was performed using the standard protocol during bolus administration of intravenous contrast. Multiplanar CT image reconstructions and MIPs were obtained to evaluate the vascular anatomy. RADIATION DOSE  REDUCTION: This exam was performed according to the departmental dose-optimization program which includes automated exposure control, adjustment of the mA and/or kV according to patient size and/or use of iterative reconstruction technique. CONTRAST:  75mL OMNIPAQUE  IOHEXOL  350 MG/ML SOLN COMPARISON:  June 30, 2021 FINDINGS: Cardiovascular: Satisfactory opacification of the pulmonary arteries to the segmental level. No evidence of pulmonary embolism. Normal heart size. No pericardial effusion. Mediastinum/Nodes: No enlarged mediastinal, hilar, or axillary lymph nodes. Thyroid  gland, trachea, and  esophagus demonstrate no significant findings. Lungs/Pleura: Lungs are clear. No pleural effusion or pneumothorax. Upper Abdomen: No acute abnormality. Musculoskeletal: No chest wall abnormality. No acute or significant osseous findings. Review of the MIP images confirms the above findings. IMPRESSION: No evidence of pulmonary embolism or acute cardiopulmonary disease. Electronically Signed   By: Suzen Dials M.D.   On: 01/17/2024 13:05   CT Cervical Spine Wo Contrast Result Date: 01/17/2024 EXAM: CT CERVICAL SPINE WITHOUT CONTRAST 01/17/2024 11:20:40 AM TECHNIQUE: CT of the cervical spine was performed without the administration of intravenous contrast. Multiplanar reformatted images are provided for review. Automated exposure control, iterative reconstruction, and/or weight based adjustment of the mA/kV was utilized to reduce the radiation dose to as low as reasonably achievable. COMPARISON: 01/26/2011 CLINICAL HISTORY: Cervical radiculopathy, no red flags. Upper back pain for 3 weeks. FINDINGS: BONES AND ALIGNMENT: Reversal of the normal cervical lordosis is similar to the prior study. Slight degenerative anterolisthesis at T3-T4 is new. No acute fracture or traumatic malalignment. DEGENERATIVE CHANGES: Progressive endplate degenerative changes are present at C4-C5 where uncovertebral spurring results in moderate  foraminal narrowing bilaterally, left greater than right. SOFT TISSUES: No prevertebral soft tissue swelling. IMPRESSION: 1. Endplate degenerative changes at C4-5 with uncovertebral spurring resulting in moderate foraminal narrowing bilaterally, left greater than right. Electronically signed by: Lonni Necessary MD 01/17/2024 11:33 AM EST RP Workstation: [id number]   DG Chest 2 View Result Date: 01/17/2024 EXAM: 2 VIEW(S) XRAY OF THE CHEST 01/17/2024 07:31:00 AM COMPARISON: 05/02/2023 CLINICAL HISTORY: sob FINDINGS: LUNGS AND PLEURA: No focal pulmonary opacity. No pleural effusion. No pneumothorax. HEART AND MEDIASTINUM: No acute abnormality of the cardiac and mediastinal silhouettes. BONES AND SOFT TISSUES: No acute osseous abnormality. IMPRESSION: 1. No acute process. Electronically signed by: Evalene Coho MD 01/17/2024 07:39 AM EST RP Workstation: [id number]     Procedures   Medications Ordered in the ED  morphine (PF) 4 MG/ML injection 4 mg (4 mg Intravenous Given 01/17/24 0945)  ondansetron (ZOFRAN) injection 4 mg (4 mg Intravenous Given 01/17/24 0941)  iohexol  (OMNIPAQUE ) 350 MG/ML injection 75 mL (75 mLs Intravenous Contrast Given 01/17/24 1121)  ketorolac  (TORADOL ) 30 MG/ML injection 30 mg (30 mg Intravenous Given 01/17/24 1225)                                    Medical Decision Making Amount and/or Complexity of Data Reviewed Labs: ordered. Radiology: ordered.  Risk Prescription drug management.   This patient presents to the ED for concern of sob and back pain, this involves an extensive number of treatment options, and is a complaint that carries with it a high risk of complications and morbidity.  The differential diagnosis includes pna, PE, msk, cervical radiculopathy   Co morbidities that complicate the patient evaluation  Htn and anxiety   Additional history obtained:  Additional history obtained from epic chart review External records from outside  source obtained and reviewed including husband   Lab Tests:  I Ordered, and personally interpreted labs.  The pertinent results include:  cbc nl, bmp nl, trop nl   Imaging Studies ordered:  I ordered imaging studies including cxr, ct chest, ct neck  I independently visualized and interpreted imaging which showed CXR: No acute process.  CT cervical spine:  Endplate degenerative changes at C4-5 with uncovertebral spurring resulting  in moderate foraminal narrowing bilaterally, left greater than right.  CT chest: No evidence of pulmonary embolism  or acute cardiopulmonary disease.  I agree with the radiologist interpretation   Medicines ordered and prescription drug management:  I ordered medication including morphine/zofran  for sx  Reevaluation of the patient after these medicines showed that the patient improved I have reviewed the patients home medicines and have made adjustments as needed   Test Considered:  ct   Critical Interventions:  Pain control   Problem List / ED Course:  Cervical radiculopathy:  pain has improved with meds.  Prednisone  taper did not help, so I did not send her with that.  She is stable for d/c.  Return if worse.  F/u with NS.   Reevaluation:  After the interventions noted above, I reevaluated the patient and found that they have :improved   Social Determinants of Health:  Lives at home   Dispostion:  After consideration of the diagnostic results and the patients response to treatment, I feel that the patent would benefit from discharge with outpatient f/u.       Final diagnoses:  Cervical radiculopathy    ED Discharge Orders          Ordered    oxyCODONE -acetaminophen  (PERCOCET/ROXICET) 5-325 MG tablet  Every 6 hours PRN        01/17/24 1321    celecoxib (CELEBREX) 200 MG capsule  2 times daily        01/17/24 1321               Dean Clarity, MD 01/17/24 1324  "

## 2024-01-17 NOTE — ED Notes (Signed)
Pt given discharge instructions and verbalized understanding.

## 2024-01-17 NOTE — Discharge Instructions (Addendum)
 Take the celebrex instead of ibuprofen  or other NSAIDs.

## 2024-01-17 NOTE — ED Triage Notes (Signed)
 Pt here from home with c/o sob and upper back pain times 3 weeks , just finished up prednisone  taper with minimal relief , pain is worse with a deep breath
